# Patient Record
Sex: Male | Born: 1965 | Race: Black or African American | Hispanic: No | Marital: Married | Smoking: Former smoker
Health system: Southern US, Community
[De-identification: ages and names within clinical notes are randomized; demographics above are authoritative.]

## PROBLEM LIST (undated history)

## (undated) DIAGNOSIS — I82A11 Acute embolism and thrombosis of right axillary vein: Secondary | ICD-10-CM

## (undated) DIAGNOSIS — E785 Hyperlipidemia, unspecified: Secondary | ICD-10-CM

## (undated) DIAGNOSIS — N189 Chronic kidney disease, unspecified: Secondary | ICD-10-CM

## (undated) HISTORY — DX: Hyperlipidemia, unspecified: E78.5

---

## 2001-01-02 ENCOUNTER — Emergency Department (HOSPITAL_COMMUNITY): Admission: EM | Admit: 2001-01-02 | Discharge: 2001-01-02 | Payer: Self-pay | Admitting: Emergency Medicine

## 2001-01-02 ENCOUNTER — Encounter: Payer: Self-pay | Admitting: Emergency Medicine

## 2009-03-13 ENCOUNTER — Emergency Department (HOSPITAL_COMMUNITY): Admission: EM | Admit: 2009-03-13 | Discharge: 2009-03-13 | Payer: Self-pay | Admitting: Emergency Medicine

## 2011-01-04 LAB — POCT CARDIAC MARKERS
CKMB, poc: <1 ng/mL — ABNORMAL LOW (ref 1.0–8.0)
Myoglobin, poc: 79.1 ng/mL (ref 12–200)
Troponin i, poc: <0.05 ng/mL (ref 0.00–0.09)

## 2011-01-04 LAB — POCT I-STAT, CHEM 8
BUN: 10 mg/dL (ref 6–23)
Calcium, Ion: 1.1 mmol/L — ABNORMAL LOW (ref 1.12–1.32)
Chloride: 103 mEq/L (ref 96–112)
Creatinine, Ser: 1.1 mg/dL (ref 0.4–1.5)
Glucose, Bld: 107 mg/dL — ABNORMAL HIGH (ref 70–99)
HCT: 48 % (ref 39.0–52.0)
Hemoglobin: 16.3 g/dL (ref 13.0–17.0)
Potassium: 4.1 mEq/L (ref 3.5–5.1)
Sodium: 139 mEq/L (ref 135–145)
TCO2: 26 mmol/L (ref 0–100)

## 2012-08-18 ENCOUNTER — Ambulatory Visit (INDEPENDENT_AMBULATORY_CARE_PROVIDER_SITE_OTHER): Payer: 59 | Admitting: General Surgery

## 2012-08-18 ENCOUNTER — Encounter (INDEPENDENT_AMBULATORY_CARE_PROVIDER_SITE_OTHER): Payer: Self-pay | Admitting: General Surgery

## 2012-08-18 VITALS — BP 114/80 | HR 89 | Ht 71.0 in | Wt 184.6 lb

## 2012-08-18 DIAGNOSIS — D170 Benign lipomatous neoplasm of skin and subcutaneous tissue of head, face and neck: Secondary | ICD-10-CM

## 2012-08-18 NOTE — Progress Notes (Signed)
Patient ID: Paul Swanson, male   DOB: 02-06-1966, 46 y.o.   MRN: [id number]  Chief Complaint  Patient presents with  . Pre-op Exam    eval cyst on scalp    HPI Paul Swanson is a 46 y.o. male.  Has previous history of having cysts of his head. Patient underwent local excision from his PCP of the left posterior cyst. Patient has a midline forehead cyst which is to be excised under local. The size of greater than was expected the patient was referred for evaluation and excision. Patient states that the areas that were drained. HPI  Past Medical History  Diagnosis Date  . Hyperlipidemia     History reviewed. No pertinent past surgical history.  History reviewed. No pertinent family history.  Social History History  Substance Use Topics  . Smoking status: Former Games developer  . Smokeless tobacco: Former Neurosurgeon    Quit date: 08/19/1991  . Alcohol Use: Yes     Comment: social    No Known Allergies  Current Outpatient Prescriptions  Medication Sig Dispense Refill  . simvastatin (ZOCOR) 20 MG tablet Take 20 mg by mouth every evening.        Review of Systems Review of Systems  All other systems reviewed and are negative.    Blood pressure 114/80, pulse 89, height 5\' 11"  (1.803 m), weight 184 lb 9.6 oz (83.734 kg), SpO2 97.00%.  Physical Exam Physical Exam  Constitutional: He appears well-developed and well-nourished.  HENT:  Head: Normocephalic and atraumatic.    Eyes: Conjunctivae normal and EOM are normal. Pupils are equal, round, and reactive to light.  Neck: Normal range of motion. Neck supple.  Cardiovascular: Normal rate, regular rhythm and normal heart sounds.   Pulmonary/Chest: Effort normal and breath sounds normal.  Abdominal: Bowel sounds are normal.  Musculoskeletal: Normal range of motion.  Neurological: He is alert.    Data Reviewed none  Assessment    The patient is a 46 year old male with a forehead lipoma.    Plan    We'll plan going to the  operating room for excision of the fatty mass.  All risks and benefits were discussed with the patient, to generally include infection, bleeding, damage to surrounding structures, and recurrence. Alternatives were offered and described.  All questions were answered and the patient voiced understanding of the procedure and wishes to proceed at this point.        Marigene Ehlers., Riyan Gavina 08/18/2012, 2:07 PM

## 2012-08-29 ENCOUNTER — Ambulatory Visit (INDEPENDENT_AMBULATORY_CARE_PROVIDER_SITE_OTHER): Payer: 59 | Admitting: General Surgery

## 2012-09-18 ENCOUNTER — Other Ambulatory Visit (INDEPENDENT_AMBULATORY_CARE_PROVIDER_SITE_OTHER): Payer: Self-pay | Admitting: General Surgery

## 2012-09-18 DIAGNOSIS — D1739 Benign lipomatous neoplasm of skin and subcutaneous tissue of other sites: Secondary | ICD-10-CM

## 2012-10-03 ENCOUNTER — Encounter (INDEPENDENT_AMBULATORY_CARE_PROVIDER_SITE_OTHER): Payer: Self-pay | Admitting: General Surgery

## 2012-10-03 ENCOUNTER — Ambulatory Visit (INDEPENDENT_AMBULATORY_CARE_PROVIDER_SITE_OTHER): Payer: 59 | Admitting: General Surgery

## 2012-10-03 ENCOUNTER — Encounter (INDEPENDENT_AMBULATORY_CARE_PROVIDER_SITE_OTHER): Payer: 59 | Admitting: General Surgery

## 2012-10-03 VITALS — BP 126/78 | HR 78 | Temp 98.8°F | Resp 18 | Ht 71.0 in | Wt 188.0 lb

## 2012-10-03 DIAGNOSIS — D179 Benign lipomatous neoplasm, unspecified: Secondary | ICD-10-CM

## 2012-10-03 NOTE — Progress Notes (Signed)
Patient ID: Paul Swanson, male   DOB: [birth year], 47 y.o.   MRN: [id number] The patient is a 47 year old male status post excision of forehead. Patient has been doing well postoperatively without any complaint.  On exam:  The wound Clean dry and intact  Assessment and plan:  Patient okay to shower. Stitches and Dermabond wall disintegrated on her own. The patient follow up when necessary.

## 2013-08-27 DIAGNOSIS — I82A11 Acute embolism and thrombosis of right axillary vein: Secondary | ICD-10-CM

## 2013-08-27 HISTORY — DX: Acute embolism and thrombosis of right axillary vein: I82.A11

## 2013-09-05 ENCOUNTER — Emergency Department (HOSPITAL_COMMUNITY)
Admission: EM | Admit: 2013-09-05 | Discharge: 2013-09-05 | Disposition: A | Discharge status: Home / Self Care | Payer: BC Managed Care – PPO | Attending: Emergency Medicine | Admitting: Emergency Medicine

## 2013-09-05 ENCOUNTER — Encounter (HOSPITAL_COMMUNITY): Payer: Self-pay | Admitting: Emergency Medicine

## 2013-09-05 DIAGNOSIS — Z87891 Personal history of nicotine dependence: Secondary | ICD-10-CM | POA: Insufficient documentation

## 2013-09-05 DIAGNOSIS — I82401 Acute embolism and thrombosis of unspecified deep veins of right lower extremity: Secondary | ICD-10-CM

## 2013-09-05 DIAGNOSIS — I82409 Acute embolism and thrombosis of unspecified deep veins of unspecified lower extremity: Secondary | ICD-10-CM | POA: Insufficient documentation

## 2013-09-05 DIAGNOSIS — E785 Hyperlipidemia, unspecified: Secondary | ICD-10-CM | POA: Insufficient documentation

## 2013-09-05 MED ORDER — HYDROCODONE-ACETAMINOPHEN 5-325 MG PO TABS: 1.0000 | ORAL_TABLET | Freq: Four times a day (QID) | ORAL | Status: DC | PRN

## 2013-09-05 MED ORDER — XARELTO VTE STARTER PACK 15 & 20 MG PO TBPK: 15.0000 mg | ORAL_TABLET | ORAL | Status: AC

## 2013-09-05 NOTE — ED Provider Notes (Signed)
CSN: [id number]     Arrival date & time 09/05/13  1752 History   This chart was scribed for Paul Crumble, PA-C, with Shanna Cisco, MD, by Paul Swanson, ED Scribe. This patient was seen in room TR11C/TR11C and the patient's care was started at   6:44 PM    Chief Complaint  Patient presents with  . Leg Pain   The history is provided by the patient. No language interpreter was used.   HPI Comments: Paul Swanson is a 47 y.o. male who presents to the Emergency Department complaining of right leg pain in calf onset this morning while walking. Pt states that he has as seen orthopedic today prior to visit. Pt reports that he has recently traveled on an airplane to [place] that departed Saturday 09/01/13, and returned Sunday 09/02/13, and was concerned . He states that walking worsens the pain. Pt denies history of DVT. Pt denies any other pain or symptoms.   Past Medical History  Diagnosis Date  . Hyperlipidemia    History reviewed. No pertinent past surgical history. History reviewed. No pertinent family history. History  Substance Use Topics  . Smoking status: Former Games developer  . Smokeless tobacco: Former Neurosurgeon    Quit date: 08/19/1991  . Alcohol Use: Yes     Comment: social    Review of Systems  Musculoskeletal: Positive for myalgias (right calf).  All other systems reviewed and are negative.    Allergies  Review of patient's allergies indicates no known allergies.  Home Medications   Current Outpatient Rx  Name  Route  Sig  Dispense  Refill  . cyclobenzaprine (FLEXERIL) 10 MG tablet   Oral   Take 10 mg by mouth 3 (three) times daily as needed for muscle spasms.         Marland Kitchen ibuprofen (ADVIL,MOTRIN) 200 MG tablet   Oral   Take 200 mg by mouth every 6 (six) hours as needed for fever.         . simvastatin (ZOCOR) 20 MG tablet   Oral   Take 20 mg by mouth every evening.          Triage Vitals BP 124/86  Pulse 76  Temp(Src) 98.4 F (36.9 C) (Oral)  Resp 16   Wt 190 lb 4 oz (86.297 kg)  SpO2 100%  Physical Exam  Nursing note and vitals reviewed. Constitutional: He is oriented to person, place, and time. He appears well-developed and well-nourished. No distress.  HENT:  Head: Normocephalic and atraumatic.  Eyes: EOM are normal.  Neck: Neck supple. No tracheal deviation present.  Cardiovascular: Normal rate.   Normal DP pulses   Pulmonary/Chest: Effort normal. No respiratory distress.  Musculoskeletal: Normal range of motion.  Normal appearing right knee, calf and ankle. No significant swelling. Tenderness palpation of right posterior calf muscle. Pain with dorsilflexion, plantar flexion against resistance.  Positive homan's sign  Neurological: He is alert and oriented to person, place, and time.  Skin: Skin is warm and dry.  Psychiatric: He has a normal mood and affect. His behavior is normal.    ED Course  Procedures (including critical care time) DIAGNOSTIC STUDIES: Oxygen Saturation is 100% on RA, normal by my interpretation.    COORDINATION OF CARE: 6:50 PM- Pt advised of plan for treatment and pt agrees.    Labs Review Labs Reviewed - No data to display Imaging Review No results found.  EKG Interpretation   None      Author: Gara Kroner, RVT  Service: Vascular Lab Author Type: Cardiovascular Sonographer   Filed: 09/05/2013 7:20 PM Note Time: 09/05/2013 7:18 PM Status: Signed   Editor: Gara Kroner, RVT (Cardiovascular Sonographer)      VASCULAR LAB  PRELIMINARY PRELIMINARY PRELIMINARY PRELIMINARY  Right lower extremity venous duplex completed.  Preliminary report: Right: DVT noted in the proximal PTV and peroneal v. No propagation into the popliteal vein noted. No evidence of superficial thrombosis. No Baker's cyst.  Swanson, Paul, RVT  09/05/2013, 7:18 PM    MDM   1. DVT (deep venous thrombosis), right     Pt is positive for DVT. No signs of PE. VS normal. Offered options of starting on xarelto and  coumadin/warfarin. Pt chose xarelto at this time. Will follow up with PCP closely for recheck. Given list of symptoms that should prompt his return. Questions answered.   Filed Vitals:   09/05/13 1808  BP: 124/86  Pulse: 76  Temp: 98.4 F (36.9 C)  Resp: 16    I personally performed the services described in this documentation, which was scribed in my presence. The recorded information has been reviewed and is accurate.    Lottie Mussel, PA-C 09/05/13 1931

## 2013-09-05 NOTE — Progress Notes (Signed)
VASCULAR LAB PRELIMINARY  PRELIMINARY  PRELIMINARY  PRELIMINARY  Right lower extremity venous duplex completed.    Preliminary report:  Right:  DVT noted in the proximal PTV and peroneal v. No propagation into the popliteal vein noted. No evidence of superficial thrombosis.  No Baker's cyst.  Beth Spackman, RVT 09/05/2013, 7:18 PM

## 2013-09-05 NOTE — ED Notes (Signed)
Venous doppler will be coming , stated by Nursing Secretary Victorino Dike

## 2013-09-05 NOTE — ED Notes (Signed)
Victorino Dike , Nursing Secretary is calling us for doppler for pt.

## 2013-09-05 NOTE — ED Notes (Signed)
Pt c/o rt calf pain that started this morning. Pt went to urgent care to be evaluated for possible DVT. Pt rates pain 7/10 when ambulating, 3/10 when sitting. Pt states he went out of town recently on a plane. No redness, swelling, or warmth noted.

## 2013-09-05 NOTE — ED Notes (Signed)
Presents from [facility] ortho docs with right calf pain that began this AM, +homans sign. Concerned for DVT sent here for doppler. Recent air travel of 4 hours. dneis hx of DVT. No redness to calf. Walking makes pain worse. Cms intact.

## 2013-09-06 ENCOUNTER — Ambulatory Visit
Admission: RE | Admit: 2013-09-06 | Discharge: 2013-09-06 | Disposition: A | Discharge status: Home / Self Care | Payer: 59 | Source: Ambulatory Visit | Attending: Family Medicine | Admitting: Family Medicine

## 2013-09-06 ENCOUNTER — Other Ambulatory Visit: Payer: Self-pay | Admitting: Family Medicine

## 2013-09-06 DIAGNOSIS — I82401 Acute embolism and thrombosis of unspecified deep veins of right lower extremity: Secondary | ICD-10-CM

## 2013-09-06 MED ORDER — IOHEXOL 350 MG/ML SOLN
100.0000 mL | Freq: Once | INTRAVENOUS | Status: AC | PRN
  Administered 2013-09-06: 100 mL via INTRAVENOUS

## 2013-09-06 NOTE — ED Provider Notes (Signed)
Medical screening examination/treatment/procedure(s) were performed by non-physician practitioner and as supervising physician I was immediately available for consultation/collaboration.  EKG Interpretation   None         Megan E Docherty, MD 09/06/13 0121 

## 2013-09-15 ENCOUNTER — Emergency Department (HOSPITAL_COMMUNITY)
Admission: EM | Admit: 2013-09-15 | Discharge: 2013-09-15 | Disposition: A | Discharge status: Home / Self Care | Payer: BC Managed Care – PPO | Attending: Emergency Medicine | Admitting: Emergency Medicine

## 2013-09-15 ENCOUNTER — Encounter (HOSPITAL_COMMUNITY): Payer: Self-pay | Admitting: Emergency Medicine

## 2013-09-15 DIAGNOSIS — M79604 Pain in right leg: Secondary | ICD-10-CM

## 2013-09-15 DIAGNOSIS — M7981 Nontraumatic hematoma of soft tissue: Secondary | ICD-10-CM | POA: Insufficient documentation

## 2013-09-15 DIAGNOSIS — Z86718 Personal history of other venous thrombosis and embolism: Secondary | ICD-10-CM | POA: Insufficient documentation

## 2013-09-15 DIAGNOSIS — R58 Hemorrhage, not elsewhere classified: Secondary | ICD-10-CM

## 2013-09-15 DIAGNOSIS — Z87891 Personal history of nicotine dependence: Secondary | ICD-10-CM | POA: Insufficient documentation

## 2013-09-15 DIAGNOSIS — E785 Hyperlipidemia, unspecified: Secondary | ICD-10-CM | POA: Insufficient documentation

## 2013-09-15 DIAGNOSIS — Z7901 Long term (current) use of anticoagulants: Secondary | ICD-10-CM | POA: Insufficient documentation

## 2013-09-15 DIAGNOSIS — M79609 Pain in unspecified limb: Secondary | ICD-10-CM | POA: Insufficient documentation

## 2013-09-15 HISTORY — DX: Acute embolism and thrombosis of right axillary vein: I82.A11

## 2013-09-15 NOTE — ED Provider Notes (Signed)
Medical screening examination/treatment/procedure(s) were performed by non-physician practitioner and as supervising physician I was immediately available for consultation/collaboration.  EKG Interpretation   None        Zaryah Seckel R. Krystyna Cleckley, MD 09/15/13 1536 

## 2013-09-15 NOTE — ED Notes (Signed)
Patient is from home. Patient complains of right leg pain and bruise. Patient was treated for DVT last week at [facility] started on Xarelto. Patient states he was icing his leg last night for pain and he fell asleep with ice on it last night, when he woke this morning he noticed the bruise. Patient is AX0X3 in NAD.

## 2013-09-15 NOTE — ED Provider Notes (Signed)
CSN: [id number]     Arrival date & time 09/15/13  1025 History   First MD Initiated Contact with Patient 09/15/13 1051     Chief Complaint  Patient presents with  . Leg Pain    Bruise on leg    (Consider location/radiation/quality/duration/timing/severity/associated sxs/prior Treatment) HPI Comments: Patient presents with a chief complaint of right lower leg pain and a small area of bruising.  He was diagnosed with a DVT of the right leg ten days ago.  He was started on Xarelto at that time.  He reports that he has been compliant with this medication.  He states that last evening he applied ice to his leg and then fell asleep.  He reports that the ice was on his leg for 3-4 hours before he woke up.  This morning he noticed a small bruise in the area where the ice had been.  He denies any acute injury or trauma.  He denies numbness or tingling of his leg or foot.  He denies any swelling.  He reports that he is having mild pain of his right calf, but reports that he has had pain since he was diagnosed ten days ago.  He denies any increase in pain.  He denies chest pain or SOB.  The history is provided by the patient.    Past Medical History  Diagnosis Date  . Hyperlipidemia   . DVT of axillary vein, acute right 12/14   History reviewed. No pertinent past surgical history. No family history on file. History  Substance Use Topics  . Smoking status: Former Games developer  . Smokeless tobacco: Former Neurosurgeon    Quit date: 08/19/1991  . Alcohol Use: Yes     Comment: social    Review of Systems  All other systems reviewed and are negative.    Allergies  Review of patient's allergies indicates no known allergies.  Home Medications   Current Outpatient Rx  Name  Route  Sig  Dispense  Refill  . cyclobenzaprine (FLEXERIL) 10 MG tablet   Oral   Take 10 mg by mouth 3 (three) times daily as needed for muscle spasms.         Marland Kitchen HYDROcodone-acetaminophen (NORCO) 5-325 MG per tablet   Oral  Take 1 tablet by mouth every 6 (six) hours as needed for moderate pain.   20 tablet   0   . ibuprofen (ADVIL,MOTRIN) 200 MG tablet   Oral   Take 200 mg by mouth every 6 (six) hours as needed for fever.         . simvastatin (ZOCOR) 20 MG tablet   Oral   Take 20 mg by mouth every evening.         Carlena Hurl STARTER PACK 15 & 20 MG TBPK   Oral   Take 15-20 mg by mouth as directed. Take as directed on package: Start with one 15mg  tablet by mouth twice a day with food. On Day 22, switch to one 20mg  tablet once a day with food.   51 each   0     Dispense as written.    BP 135/85  Pulse 85  Temp(Src) 98 F (36.7 C) (Oral)  Resp 20  SpO2 94% Physical Exam  Nursing note and vitals reviewed. Constitutional: He appears well-developed and well-nourished.  HENT:  Head: Normocephalic and atraumatic.  Neck: Normal range of motion. Neck supple.  Cardiovascular: Normal rate, regular rhythm and normal heart sounds.   Pulses:  Dorsalis pedis pulses are 2+ on the right side, and 2+ on the left side.  Pulmonary/Chest: Effort normal and breath sounds normal.  Neurological: He is alert.  Distal sensation of the right foot is intact  Skin: Skin is warm and dry.     Psychiatric: He has a normal mood and affect.    ED Course  Procedures (including critical care time) Labs Review Labs Reviewed - No data to display Imaging Review No results found.  EKG Interpretation   None       MDM  No diagnosis found. Patient presenting with mild right lower leg pain and a small bruise of the right lower leg.  No acute injury or trauma.  Patient diagnosed with DVT ten days ago and started on Xarelto.  He reports that he is compliant with medications.  Patient with small bruise to the right leg.  He is neurovascularly intact.  He denies SOB or chest pain.  VSS.   Feel that the patient is stable for discharge.  Patient instructed to follow up with PCP.  Return precautions  given.    Santiago Glad, PA-C 09/15/13 1301

## 2013-09-25 ENCOUNTER — Telehealth: Payer: Self-pay | Admitting: Oncology

## 2013-09-25 NOTE — Telephone Encounter (Signed)
PT RETURN CALL AND GVE NP APPT 12/31 @ 1:30 W/DR. SHADAD REFERRING DR. LISA MILLER DX-POSITIVE FACTOR VIII

## 2013-09-25 NOTE — Telephone Encounter (Signed)
C/D 09/25/13 for appt. 09/26/13

## 2013-09-26 ENCOUNTER — Other Ambulatory Visit: Payer: BC Managed Care – PPO

## 2013-09-26 ENCOUNTER — Ambulatory Visit: Payer: BC Managed Care – PPO

## 2013-09-26 ENCOUNTER — Telehealth: Payer: Self-pay | Admitting: *Deleted

## 2013-09-26 ENCOUNTER — Ambulatory Visit (HOSPITAL_BASED_OUTPATIENT_CLINIC_OR_DEPARTMENT_OTHER): Payer: BC Managed Care – PPO | Admitting: Oncology

## 2013-09-26 VITALS — BP 132/93 | HR 80 | Temp 98.3°F | Resp 20 | Ht 71.0 in | Wt 193.0 lb

## 2013-09-26 DIAGNOSIS — Z86718 Personal history of other venous thrombosis and embolism: Secondary | ICD-10-CM

## 2013-09-26 DIAGNOSIS — Z7901 Long term (current) use of anticoagulants: Secondary | ICD-10-CM

## 2013-09-26 DIAGNOSIS — I82409 Acute embolism and thrombosis of unspecified deep veins of unspecified lower extremity: Secondary | ICD-10-CM

## 2013-09-26 NOTE — Consult Note (Signed)
Reason for Referral: Deep vein thrombosis.   HPI: This is a 47 year old gentleman native of [place] on currently living in [place] for an extended period of time. He is a healthy African American gentleman with history of hyperlipidemia but really no other comorbid conditions. Was in his usual state of health, when he presented with right-sided calf pain that started 3 days after a flight to [place]. He presented to the emergency department on 09/05/2013 and underwent lower extremity Dopplers which revealed the presence of a right posterior tibial vein and the right peroneal vein deep vein thrombosis. He was started on Xarelto and had a CT scan of the chest done on 09/06/2013 which showed no pulmonary embolism. He was evaluated by his primary care physician and had a hypercoagulable panel which I have reviewed was essentially unremarkable except for an elevated factor VIII activity. 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 Patient referred to me for the evaluation for inherited thrombophilia. Clinically, he is feeling relatively well and hasn't noticed significant decrease in his calf pain. He has not reported any other symptoms leading up to this diagnosis. He has not reported any abdominal pain or weight loss. Has not reported any early satiety or change in his bowel habits. Has not reported any hematochezia or melena. Has not reported any change in the color of his urine. He does not endorse any provoking factors other than history up to [place]. 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 He denied any trauma or orthopedic operations. He did not have any medication or supplements. He denied any using a hormone supplements as well.   Past Medical History  Diagnosis Date  . Hyperlipidemia   . DVT of axillary vein, acute right 12/14  :  No past surgical history on file.:  Current Outpatient Prescriptions  Medication Sig Dispense Refill  .  ibuprofen (ADVIL,MOTRIN) 200 MG tablet Take 800 mg by mouth every 6 (six) hours as needed for fever (pain).       . simvastatin (ZOCOR) 20 MG tablet Take 20 mg by mouth every evening.      Carlena Hurl STARTER PACK 15 & 20 MG TBPK Take 15-20 mg by mouth as directed. Take as directed on package: Start with one 15mg  tablet by mouth twice a day with food. On Day 22, switch to one 20mg  tablet once a day with food.  51 each  0   No current facility-administered medications for this visit.       No Known Allergies:  Family History: His family history was reviewed today and his parents are in reasonably good health.  There is no history any thrombosis and his parents or extended family. There is no history of malignancies or any blood disorders.   History   Social History  . Marital Status: Married    Spouse Name: N/A    Number of Children: N/A  . Years of Education: N/A   Occupational History  . Not on file.   Social History Main Topics  . Smoking status: Former Games developer  . Smokeless tobacco: Former Neurosurgeon    Quit date: 08/19/1991  . Alcohol Use: Yes     Comment: social  . Drug Use: No  . Sexual Activity: Not on file   Other Topics Concern  . Not on file   Social History Narrative  . No narrative on file  :  Constitutional: negative for anorexia, chills, fatigue and  night sweats Eyes: negative for icterus, irritation and visual disturbance Ears, nose, mouth, throat, and face: negative  for epistaxis, hoarseness and sore mouth Respiratory:  Negative for asthma, cough, hemoptysis and wheezing Cardiovascular: Negative for chest pain, dyspnea, fatigue and lower extremity edema Gastrointestinal: negative for abdominal pain, diarrhea, jaundice and odynophagia Genitourinary:negative for hematuria, hesitancy and nocturia Hematologic/lymphatic: for bleeding, easy bruising and lymphadenopathy Musculoskeletal:negative for arthralgias, back pain and bone pain Neurological: negative for  coordination problems, dizziness and gait problems Behavioral/Psych: negative for anxiety, depression and fatigue Endocrine: negative for diabetic symptoms including blurry vision and pruritus Allergic/Immunologic: negative for urticaria  Exam: Blood pressure 132/93, pulse 80, temperature 98.3 F (36.8 C), resp. rate 20, height 5\' 11"  (1.803 m), weight 193 lb (87.544 kg), SpO2 99.00%. General appearance: alert, cooperative and appears stated age Head: Normocephalic, without obvious abnormality, atraumatic Eyes: conjunctivae/corneas clear. PERRL, EOM's intact. Fundi benign. Throat: lips, mucosa, and tongue normal; teeth and gums normal Neck: no adenopathy, no carotid bruit, no JVD, supple, symmetrical, trachea midline and thyroid not enlarged, symmetric, no tenderness/mass/nodules Back: symmetric, no curvature. ROM normal. No CVA tenderness. Resp: clear to auscultation bilaterally Chest wall: no tenderness Cardio: regular rate and rhythm, S1, S2 normal, no murmur, click, rub or gallop GI: soft, non-tender; bowel sounds normal; no masses,  no organomegaly Extremities: extremities normal, atraumatic, no cyanosis or edema Pulses: 2+ and symmetric Skin: Skin color, texture, turgor normal. No rashes or lesions Lymph nodes: Cervical, supraclavicular, and axillary nodes normal. Neurologic: Grossly normal   Ct Angio Chest Pe W/cm &/or Wo Cm  09/06/2013   CLINICAL DATA:  New diagnosis right lower extremity DVT.  EXAM: CT ANGIOGRAPHY CHEST WITH CONTRAST  TECHNIQUE: Multidetector CT imaging of the chest was performed using the standard protocol during bolus administration of intravenous contrast. Multiplanar CT image reconstructions including MIPs were obtained to evaluate the vascular anatomy.  CONTRAST:  100 mL OMNIPAQUE IOHEXOL 350 MG/ML SOLN  COMPARISON:  Plain film of the chest 03/13/2009.  FINDINGS: No pulmonary embolus is identified. Heart size is normal. No pleural or pericardial. No axillary,  hilar or mediastinal lymphadenopathy. Lungs demonstrate only some dependent atelectatic change. Incidentally imaged upper abdomen is unremarkable. No focal bony abnormality.  Review of the MIP images confirms the above findings.  IMPRESSION: Negative for pulmonary embolus.  Negative exam.   Electronically Signed   By: Drusilla Kanner M.D.   On: 09/06/2013 14:09    Assessment and Plan:   47 year old gentleman with the recent diagnosis of the right lower extremity DVT. The natural course of thrombophilia was discussed in general as well as pertaining to him his overall risk factors. I see no clear-cut provoking symptoms in his lower extremity deep vein thrombosis that was diagnosed in December of 2014. 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 He has no orthopedic surgery or exposure to drugs that could have caused this. His hypercoagulable panel was discussed today including his elevated factor VIII level. I feel that this is probably due to an acute phase reactant rather than inherited thrombophilia. I believe that his factor VIII levels should exceed the 300% before this could be considered a possible risk factor. From a management standpoint, I will advise with 6 months of anticoagulation and then I will repeat his factor VIII level at that time for confirmatory testing. I see no evidence to suggest occult malignancy causing his thrombosis at this time. He has no GI  or GU symptoms at this time to trigger any further workup. I did recommend that he undergoes age-appropriate cancer screening including a colonoscopy in the near future. All his questions were answered to his satisfaction.

## 2013-09-26 NOTE — Progress Notes (Signed)
Please see consult note.  

## 2013-09-26 NOTE — Telephone Encounter (Signed)
appts made and printed...td 

## 2013-12-07 ENCOUNTER — Telehealth: Payer: Self-pay | Admitting: *Deleted

## 2013-12-07 NOTE — Telephone Encounter (Signed)
Patient's wife calling to say patient has been reading on the internet about the possible side effects of xaralto. Patient wants to know if there is a better drug he should be taking, with less side effects? Per dr Alen Blew, no, continue with xaralto. All of the drugs have similar side effects.

## 2014-03-04 ENCOUNTER — Telehealth: Payer: Self-pay | Admitting: Oncology

## 2014-03-04 NOTE — Telephone Encounter (Signed)
Pt called and r/s lab

## 2014-03-05 ENCOUNTER — Other Ambulatory Visit: Payer: BC Managed Care – PPO

## 2014-03-05 DIAGNOSIS — Z86718 Personal history of other venous thrombosis and embolism: Secondary | ICD-10-CM

## 2014-03-06 ENCOUNTER — Other Ambulatory Visit: Payer: BC Managed Care – PPO

## 2014-03-08 LAB — FACTOR 8 ASSAY: Coagulation Factor VIII: 80 % (ref 73–140)

## 2014-03-19 ENCOUNTER — Encounter: Payer: Self-pay | Admitting: Oncology

## 2014-03-19 ENCOUNTER — Ambulatory Visit (HOSPITAL_BASED_OUTPATIENT_CLINIC_OR_DEPARTMENT_OTHER): Payer: BC Managed Care – PPO | Admitting: Oncology

## 2014-03-19 VITALS — BP 124/75 | HR 86 | Temp 98.1°F | Resp 18 | Ht 71.0 in | Wt 187.7 lb

## 2014-03-19 DIAGNOSIS — Z7901 Long term (current) use of anticoagulants: Secondary | ICD-10-CM

## 2014-03-19 DIAGNOSIS — I82409 Acute embolism and thrombosis of unspecified deep veins of unspecified lower extremity: Secondary | ICD-10-CM

## 2014-03-19 NOTE — Progress Notes (Signed)
Hematology and Oncology Follow Up Visit  Paul Swanson [id number] October 31, 1965 48 y.o. 03/19/2014 8:56 AM  Paul Swanson, MDRoss, Paul Haste, MD   Principle Diagnosis: 48 year old gentleman with deep vein thrombosis diagnosed in December of 2014. He presented with right sided calf pain after a slight.   Current therapy: Xarelto daily the last 6 months.  Interim History:  [name] presents today for a followup visit. He is a pleasant gentleman I saw him consultation in December of 2014. He had a deep vein thrombosis as well as increase factor VIII activity. He has been on oral Xarelto without any complications. He has not reported any bleeding complications such as epistaxis, hematochezia, melena or hemoptysis. He has not reported any other clots. He has not reported any recent hospitalizations or illnesses. He did not report any calf pain or tenderness. He continues to be very active. He does not report any headaches blurred vision or double vision. Is not reporting any motor or sensory neuropathy. Is not reporting any chest pain shortness of breath or cough. Does not report any palpitation orthopnea or PND. Does not report any frequency urgency or hesitancy. Does not report any skeletal complaints. Rest of his review of systems unremarkable  Medications: I have reviewed the patient's current medications.  Current Outpatient Prescriptions  Medication Sig Dispense Refill  . ibuprofen (ADVIL,MOTRIN) 200 MG tablet Take 800 mg by mouth every 6 (six) hours as needed for fever (pain).       . simvastatin (ZOCOR) 20 MG tablet Take 20 mg by mouth every evening.      Alveda Reasons STARTER PACK 15 & 20 MG TBPK Take 15-20 mg by mouth as directed. Take as directed on package: Start with one 15mg  tablet by mouth twice a day with food. On Day 22, switch to one 20mg  tablet once a day with food.  51 each  0   No current facility-administered medications for this visit.     Allergies: No Known Allergies  Past Medical  History, Surgical history, Social history, and Family History were reviewed and updated.   Physical Exam: Blood pressure 124/75, pulse 86, temperature 98.1 F (36.7 C), resp. rate 18, height 5\' 11"  (1.803 m), weight 187 lb 11.2 oz (85.14 kg). ECOG: 0 General appearance: alert Head: Normocephalic, without obvious abnormality Neck: no adenopathy Lymph nodes: Cervical, supraclavicular, and axillary nodes normal. Heart:regular rate and rhythm, S1, S2 normal, no murmur, click, rub or gallop Lung:chest clear, no wheezing, rales, normal symmetric air entry Abdomin: soft, non-tender, without masses or organomegaly EXT:no erythema, induration, or nodules   Lab Results: Lab Results  Component Value Date   HGB 16.3 03/13/2009   HCT 48.0 03/13/2009     Chemistry      Component Value Date/Time   NA 139 03/13/2009 1926   K 4.1 03/13/2009 1926   CL 103 03/13/2009 1926   BUN 10 03/13/2009 1926   CREATININE 1.1 03/13/2009 1926   No results found for this basename: CALCIUM, ALKPHOS, AST, ALT, BILITOT      Results for [name], [name] (MRN [id number]) as of 03/19/2014 09:00  Ref. Range 03/05/2014 08:18  Coagulation Factor VIII Latest Range: 73-140 % 80     Impression and Plan:   48 year old gentleman with the following issues:  1. Deep vein thrombosis provoked by a flight and immobility. His diagnosed in December 2014 and completed 6 months of anticoagulation. His hypercoagulable panel has been within normal range and now his factor VIII level has normalized. I discussed the  options at this point is continuing Xarelto indefinitely versus stopping anticoagulation. I think the risks of continuing anticoagulation exceed stay benefit and I recommended stopping the medication at this time. He understands if he develops another blood clot that was unprovoked, hematocrit lifetime anticoagulation. I also advised him to avoid situations that prolongs immobility. I also encouraged hydration and stretching at  least every 2 hours on any long car rides or flights. Low-dose aspirin might also be a reasonable idea given his age for cardiovascular prevention purposes.   2. Age-appropriate cancer screening: Have advised him to continue doing so Coumadin colonoscopy as well as prostate cancer screening.  Followup will be as needed he'll be happy to see him in the future if needed to.  [facility], MD 6/23/20158:56 AM

## 2014-11-11 ENCOUNTER — Telehealth: Payer: Self-pay

## 2014-11-11 NOTE — Telephone Encounter (Signed)
Pt had booked a flight and is scared to fly d/t history of blood clot. He has Paediatric nurse. The insurance is asking for letter on doctor's letterhead verifying history of blood clot. Family can pick it up when ready. Wife cell phone is [phone]

## 2014-11-12 ENCOUNTER — Encounter: Payer: Self-pay | Admitting: *Deleted

## 2014-11-12 NOTE — Progress Notes (Signed)
Per patient request, signed letter left at front for patient to p/u. Re: flying. Wife nicole notified.

## 2014-11-27 ENCOUNTER — Emergency Department (HOSPITAL_COMMUNITY)
Admission: EM | Admit: 2014-11-27 | Discharge: 2014-11-27 | Disposition: A | Discharge status: Home / Self Care | Payer: BLUE CROSS/BLUE SHIELD | Attending: Emergency Medicine | Admitting: Emergency Medicine

## 2014-11-27 ENCOUNTER — Encounter (HOSPITAL_COMMUNITY): Payer: Self-pay | Admitting: Emergency Medicine

## 2014-11-27 DIAGNOSIS — Z86718 Personal history of other venous thrombosis and embolism: Secondary | ICD-10-CM | POA: Diagnosis not present

## 2014-11-27 DIAGNOSIS — M79604 Pain in right leg: Secondary | ICD-10-CM | POA: Diagnosis present

## 2014-11-27 DIAGNOSIS — Z87891 Personal history of nicotine dependence: Secondary | ICD-10-CM | POA: Diagnosis not present

## 2014-11-27 DIAGNOSIS — M79661 Pain in right lower leg: Secondary | ICD-10-CM

## 2014-11-27 DIAGNOSIS — Z7901 Long term (current) use of anticoagulants: Secondary | ICD-10-CM | POA: Insufficient documentation

## 2014-11-27 DIAGNOSIS — E785 Hyperlipidemia, unspecified: Secondary | ICD-10-CM | POA: Insufficient documentation

## 2014-11-27 MED ORDER — ENOXAPARIN SODIUM 100 MG/ML ~~LOC~~ SOLN
85.0000 mg | Freq: Once | SUBCUTANEOUS | Status: AC
  Administered 2014-11-27: 85 mg via SUBCUTANEOUS
  Filled 2014-11-27: qty 1

## 2014-11-27 NOTE — ED Notes (Signed)
Pt st's he has a hx of DVT.  St's he was on a flight 1 1/2 weeks ago.  Today started having pain in right calf area.

## 2014-11-27 NOTE — ED Provider Notes (Signed)
CSN: [id number]     Arrival date & time 11/27/14  1918 History   First MD Initiated Contact with Patient 11/27/14 2031     No chief complaint on file.  (Consider location/radiation/quality/duration/timing/severity/associated sxs/prior Treatment) HPI  Jerol Rufener is a 49 yo male presenting with report of pain in his right calf.  He states he has intermittent pain x a few weeks but today the pain became more bothersome.  He reports the pain was similar to his calf pain last year when he was diagnosed with a DVT.  He was started on Xarelto which was stopped 6 months ago.  He states he travels frequently via airplane and was concerned this pain could be another DVT.  He denies any leg swelling, redness, shortness of breath or cough.   Past Medical History  Diagnosis Date  . Hyperlipidemia   . DVT of axillary vein, acute right 12/14   History reviewed. No pertinent past surgical history. No family history on file. History  Substance Use Topics  . Smoking status: Former Research scientist (life sciences)  . Smokeless tobacco: Former Systems developer    Quit date: 08/19/1991  . Alcohol Use: Yes     Comment: social    Review of Systems  Constitutional: Negative for fever and chills.  HENT: Negative for sore throat.   Eyes: Negative for visual disturbance.  Respiratory: Negative for cough and shortness of breath.   Cardiovascular: Negative for chest pain and leg swelling.  Gastrointestinal: Negative for nausea, vomiting and diarrhea.  Genitourinary: Negative for dysuria.  Musculoskeletal: Positive for myalgias.  Skin: Negative for rash.  Neurological: Negative for weakness, numbness and headaches.      Allergies  Review of patient's allergies indicates no known allergies.  Home Medications   Prior to Admission medications   Medication Sig Start Date End Date Taking? Authorizing Provider  ibuprofen (ADVIL,MOTRIN) 200 MG tablet Take 800 mg by mouth every 6 (six) hours as needed for fever (pain).     Historical Provider,  MD  simvastatin (ZOCOR) 20 MG tablet Take 20 mg by mouth every evening.    Historical Provider, MD  XARELTO STARTER PACK 15 & 20 MG TBPK Take 15-20 mg by mouth as directed. Take as directed on package: Start with one 15mg  tablet by mouth twice a day with food. On Day 22, switch to one 20mg  tablet once a day with food. 09/05/13   Tatyana A Kirichenko, PA-C   BP 120/87 mmHg  Pulse 67  Temp(Src) 98.5 F (36.9 C) (Oral)  Resp 18  SpO2 97% Physical Exam  Constitutional: He appears well-developed and well-nourished. No distress.  HENT:  Head: Normocephalic and atraumatic.  Eyes: Conjunctivae are normal. Right eye exhibits no discharge. Left eye exhibits no discharge. No scleral icterus.  Neck: Neck supple.  Cardiovascular: Normal rate, regular rhythm and intact distal pulses.  Exam reveals no gallop and no friction rub.   No murmur heard. Pulmonary/Chest: Effort normal and breath sounds normal. No respiratory distress. He has no wheezes. He has no rales. He exhibits no tenderness.  Abdominal: Soft. There is no tenderness.  Musculoskeletal: He exhibits tenderness.  No erythema, warmth or swelling noted.  Lymphadenopathy:    He has no cervical adenopathy.  Neurological: He is alert. Coordination normal.  Skin: Skin is warm and dry. No rash noted. He is not diaphoretic.  Psychiatric: He has a normal mood and affect.  Nursing note and vitals reviewed.   ED Course  Procedures (including critical care time) Labs Review Labs  Reviewed - No data to display  Imaging Review No results found.   EKG Interpretation None      MDM   Final diagnoses:  Calf pain, right   49 yo with history of right calf DVT, treatment stopped 6 months ago, now with right calf pain and report of recent plane travel.  No swelling or redness noted on exam and no report chest pain or shortness of breath.  Discussed with pt will need outpt lower extremity doppler but will begin treatment with lovenox in the ED. Pt  is well-appearing, in no acute distress and vital signs reviewed and not concerning. He appears safe to be discharged. Return precautions provided. Pt aware of plan and in agreement.   Filed Vitals:   11/27/14 1932 11/27/14 2112  BP: 120/87 111/85  Pulse: 67 68  Temp: 98.5 F (36.9 C) 98.4 F (36.9 C)  TempSrc: Oral Oral  Resp: 18 14  SpO2: 97% 98%   Meds given in ED:  Medications  enoxaparin (LOVENOX) injection 85 mg (85 mg Subcutaneous Given 11/27/14 2113)    Discharge Medication List as of 11/27/2014  9:02 PM        Britt Bottom, NP 11/28/14 2347  Jasper Riling. Alvino Chapel, MD 11/29/14 0000

## 2014-11-27 NOTE — ED Notes (Signed)
MD at the bedside  

## 2014-11-27 NOTE — Discharge Instructions (Signed)
Please follow the directions provided.  Be sure to follow-up as directed for your outpatient study.  You were treated in the emergency department with a blood thinner.  Do not hesitate to return for any new, worsening or concerning symptoms.      SEEK IMMEDIATE MEDICAL CARE IF:  You have chest pain.  You have trouble breathing.  You have new or increased swelling or pain in one leg.  You cough up blood.  You notice blood in vomit, in a bowel movement, or in urine.

## 2014-11-28 ENCOUNTER — Ambulatory Visit (HOSPITAL_COMMUNITY)
Admission: RE | Admit: 2014-11-28 | Discharge: 2014-11-28 | Disposition: A | Discharge status: Home / Self Care | Payer: BLUE CROSS/BLUE SHIELD | Source: Ambulatory Visit | Attending: Emergency Medicine | Admitting: Emergency Medicine

## 2014-11-28 DIAGNOSIS — M79609 Pain in unspecified limb: Secondary | ICD-10-CM

## 2014-11-28 DIAGNOSIS — M79604 Pain in right leg: Secondary | ICD-10-CM | POA: Insufficient documentation

## 2014-11-28 NOTE — Progress Notes (Signed)
VASCULAR LAB PRELIMINARY  PRELIMINARY  PRELIMINARY  PRELIMINARY  Right lower extremity venous duplex completed.    Preliminary report:  Right:  No evidence of DVT, superficial thrombosis, or Baker's cyst.  Alyha Marines, RVT 11/28/2014, 2:52 PM

## 2016-01-04 ENCOUNTER — Emergency Department (HOSPITAL_COMMUNITY): Payer: BLUE CROSS/BLUE SHIELD

## 2016-01-04 ENCOUNTER — Encounter (HOSPITAL_COMMUNITY): Payer: Self-pay | Admitting: Oncology

## 2016-01-04 ENCOUNTER — Emergency Department (HOSPITAL_COMMUNITY)
Admission: EM | Admit: 2016-01-04 | Discharge: 2016-01-04 | Disposition: A | Discharge status: Home / Self Care | Payer: BLUE CROSS/BLUE SHIELD | Attending: Emergency Medicine | Admitting: Emergency Medicine

## 2016-01-04 DIAGNOSIS — Z79899 Other long term (current) drug therapy: Secondary | ICD-10-CM | POA: Diagnosis not present

## 2016-01-04 DIAGNOSIS — Z86718 Personal history of other venous thrombosis and embolism: Secondary | ICD-10-CM | POA: Insufficient documentation

## 2016-01-04 DIAGNOSIS — R319 Hematuria, unspecified: Secondary | ICD-10-CM

## 2016-01-04 DIAGNOSIS — R109 Unspecified abdominal pain: Secondary | ICD-10-CM | POA: Diagnosis present

## 2016-01-04 DIAGNOSIS — Z87891 Personal history of nicotine dependence: Secondary | ICD-10-CM | POA: Insufficient documentation

## 2016-01-04 DIAGNOSIS — N202 Calculus of kidney with calculus of ureter: Secondary | ICD-10-CM | POA: Insufficient documentation

## 2016-01-04 DIAGNOSIS — E785 Hyperlipidemia, unspecified: Secondary | ICD-10-CM | POA: Insufficient documentation

## 2016-01-04 DIAGNOSIS — N200 Calculus of kidney: Secondary | ICD-10-CM

## 2016-01-04 LAB — CBC WITH DIFFERENTIAL/PLATELET
BASOS PCT: 0 %
Basophils Absolute: 0 10*3/uL (ref 0.0–0.1)
Eosinophils Absolute: 0.1 10*3/uL (ref 0.0–0.7)
Eosinophils Relative: 1 %
HCT: 42.9 % (ref 39.0–52.0)
HEMOGLOBIN: 15 g/dL (ref 13.0–17.0)
Lymphocytes Relative: 16 %
Lymphs Abs: 1.4 10*3/uL (ref 0.7–4.0)
MCH: 33 pg (ref 26.0–34.0)
MCHC: 35 g/dL (ref 30.0–36.0)
MCV: 94.5 fL (ref 78.0–100.0)
Monocytes Absolute: 0.5 10*3/uL (ref 0.1–1.0)
Monocytes Relative: 6 %
NEUTROS PCT: 77 %
Neutro Abs: 6.4 10*3/uL (ref 1.7–7.7)
Platelets: 218 10*3/uL (ref 150–400)
RBC: 4.54 MIL/uL (ref 4.22–5.81)
RDW: 14.4 % (ref 11.5–15.5)
WBC: 8.4 10*3/uL (ref 4.0–10.5)

## 2016-01-04 LAB — URINALYSIS, ROUTINE W REFLEX MICROSCOPIC
Bilirubin Urine: NEGATIVE
Glucose, UA: NEGATIVE mg/dL
Ketones, ur: NEGATIVE mg/dL
NITRITE: NEGATIVE
PROTEIN: 30 mg/dL — AB
SPECIFIC GRAVITY, URINE: 1.024 (ref 1.005–1.030)
pH: 7.5 (ref 5.0–8.0)

## 2016-01-04 LAB — I-STAT CHEM 8, ED
BUN: 23 mg/dL — AB (ref 6–20)
CHLORIDE: 104 mmol/L (ref 101–111)
Calcium, Ion: 1.16 mmol/L (ref 1.12–1.23)
Creatinine, Ser: 1.3 mg/dL — ABNORMAL HIGH (ref 0.61–1.24)
Glucose, Bld: 130 mg/dL — ABNORMAL HIGH (ref 65–99)
HEMATOCRIT: 46 % (ref 39.0–52.0)
Hemoglobin: 15.6 g/dL (ref 13.0–17.0)
Potassium: 4 mmol/L (ref 3.5–5.1)
SODIUM: 143 mmol/L (ref 135–145)
TCO2: 27 mmol/L (ref 0–100)

## 2016-01-04 LAB — URINE MICROSCOPIC-ADD ON: Squamous Epithelial / LPF: NONE SEEN

## 2016-01-04 LAB — I-STAT CG4 LACTIC ACID, ED: Lactic Acid, Venous: 1.4 mmol/L (ref 0.5–2.0)

## 2016-01-04 MED ORDER — KETOROLAC TROMETHAMINE 30 MG/ML IJ SOLN
30.0000 mg | Freq: Once | INTRAMUSCULAR | Status: AC
  Administered 2016-01-04: 30 mg via INTRAVENOUS
  Filled 2016-01-04: qty 1

## 2016-01-04 MED ORDER — TAMSULOSIN HCL 0.4 MG PO CAPS
0.4000 mg | ORAL_CAPSULE | Freq: Every day | ORAL | Status: DC
  Administered 2016-01-04: 0.4 mg via ORAL
  Filled 2016-01-04: qty 1

## 2016-01-04 MED ORDER — OXYCODONE-ACETAMINOPHEN 5-325 MG PO TABS: 1.0000 | ORAL_TABLET | Freq: Four times a day (QID) | ORAL | Status: AC | PRN

## 2016-01-04 MED ORDER — ONDANSETRON 8 MG PO TBDP: ORAL_TABLET | ORAL | Status: AC

## 2016-01-04 MED ORDER — ONDANSETRON HCL 4 MG/2ML IJ SOLN
4.0000 mg | Freq: Once | INTRAMUSCULAR | Status: AC
  Administered 2016-01-04: 4 mg via INTRAVENOUS
  Filled 2016-01-04: qty 2

## 2016-01-04 MED ORDER — OXYCODONE-ACETAMINOPHEN 5-325 MG PO TABS
1.0000 | ORAL_TABLET | Freq: Once | ORAL | Status: AC
  Administered 2016-01-04: 1 via ORAL
  Filled 2016-01-04: qty 1

## 2016-01-04 NOTE — ED Notes (Signed)
Patient states he does not need to provide a urine sample as this was just completed at his physicians office earlier this week. Patient and wife have been updated that until a physician sees the patient nursing staff is limited in what orders we can initiate for the patient.

## 2016-01-04 NOTE — ED Notes (Signed)
MD at bedside. 

## 2016-01-04 NOTE — ED Provider Notes (Signed)
CSN: [id number]     Arrival date & time 01/04/16  0115 History  By signing my name below, I, Paul Swanson, attest that this documentation has been prepared under the direction and in the presence of Paul Rosamond, MD. Electronically Signed: Hansel Swanson, ED Scribe. 01/04/2016. 3:05 AM.    Chief Complaint  Patient presents with  . Flank Pain   Patient is a 50 y.o. male presenting with flank pain. The history is provided by the patient. No language interpreter was used.  Flank Pain This is a new problem. The current episode started 12 to 24 hours ago. The problem occurs constantly. The problem has not changed since onset.Pertinent negatives include no abdominal pain. Nothing aggravates the symptoms. Nothing relieves the symptoms. He has tried nothing for the symptoms. The treatment provided no relief.   HPI Comments: Paul Swanson is a 50 y.o. male who presents to the Emergency Department complaining of moderate right flank pain onset today. He reports that he had hematuria 8 days ago (now resolved) and was seen at [facility] for this symptom. His UA was negative. He states he followed up with his PCP after this visit and had urine culture showing only trace protein. He was started on Flomax at this time and reports temporary relief with this treatment until onset of his current pain. Pt states h/o similar pain with renal calculi. He does not currently have a urologist. Denies nausea, emesis, diarrhea, constipation, fever. Denies abdominal trauma.   Past Medical History  Diagnosis Date  . Hyperlipidemia   . DVT of axillary vein, acute right 12/14   History reviewed. No pertinent past surgical history. No family history on file. Social History  Substance Use Topics  . Smoking status: Former Research scientist (life sciences)  . Smokeless tobacco: Former Systems developer    Quit date: 08/19/1991  . Alcohol Use: Yes     Comment: social    Review of Systems  Constitutional: Negative for fever.  Gastrointestinal: Negative for nausea,  vomiting, abdominal pain, diarrhea and constipation.  Genitourinary: Positive for hematuria and flank pain ( left).  All other systems reviewed and are negative.  Allergies  Review of patient's allergies indicates no known allergies.  Home Medications   Prior to Admission medications   Medication Sig Start Date End Date Taking? Authorizing Provider  naproxen sodium (ANAPROX) 220 MG tablet Take 440 mg by mouth 2 (two) times daily as needed (pain).   Yes Historical Provider, MD  simvastatin (ZOCOR) 20 MG tablet Take 20 mg by mouth every evening.   Yes Historical Provider, MD  tamsulosin (FLOMAX) 0.4 MG CAPS capsule Take 0.4 mg by mouth daily. 12/27/15  Yes Historical Provider, MD  XARELTO STARTER PACK 15 & 20 MG TBPK Take 15-20 mg by mouth as directed. Take as directed on package: Start with one 15mg  tablet by mouth twice a day with food. On Day 22, switch to one 20mg  tablet once a day with food. Patient not taking: Reported on 01/04/2016 09/05/13   [name], PA-C   BP 146/116 mmHg  Pulse 83  Temp(Src) 97.7 F (36.5 C) (Oral)  Resp 16  Ht 5\' 11"  (1.803 m)  Wt 190 lb (86.183 kg)  BMI 26.51 kg/m2  SpO2 98% Physical Exam  Constitutional: He is oriented to person, place, and time. He appears well-developed and well-nourished.  HENT:  Head: Normocephalic and atraumatic.  Mouth/Throat: Oropharynx is clear and moist. No oropharyngeal exudate.  Eyes: Conjunctivae and EOM are normal. Pupils are equal, round, and reactive to  light.  Neck: Normal range of motion. Neck supple. No JVD present. No tracheal deviation present.  Cardiovascular: Normal rate, regular rhythm and normal heart sounds.  Exam reveals no gallop and no friction rub.   No murmur heard. RRR.   Pulmonary/Chest: Effort normal and breath sounds normal. No stridor. No respiratory distress. He has no wheezes. He has no rales.  Lungs CTA bilaterally.   Abdominal: Soft. He exhibits no distension. There is no tenderness. There  is no rebound and no guarding.  Hyperactive bowel sounds. Constipation.   Musculoskeletal: Normal range of motion.  Lymphadenopathy:    He has no cervical adenopathy.  Neurological: He is alert and oriented to person, place, and time. He has normal reflexes.  Skin: Skin is warm and dry.  Psychiatric: He has a normal mood and affect.  Nursing note and vitals reviewed.   ED Course  Procedures (including critical care time) DIAGNOSTIC STUDIES: Oxygen Saturation is 98% on RA, normal by my interpretation.    COORDINATION OF CARE: 2:58 AM Discussed treatment plan with pt at bedside which includes UA, CT renal and pt agreed to plan.   Labs Review Labs Reviewed  URINALYSIS, ROUTINE W REFLEX MICROSCOPIC (NOT AT [facility])    Imaging Review No results found. I have personally reviewed and evaluated these images and lab results as part of my medical decision-making.   EKG Interpretation None      MDM   Final diagnoses:  None   BP 146/116 mmHg  Pulse 83  Temp(Src) 97.7 F (36.5 C) (Oral)  Resp 16  Ht 5\' 11"  (1.803 m)  Wt 190 lb (86.183 kg)  BMI 26.51 kg/m2  SpO2 98%   Results for orders placed or performed in visit on 03/05/14  Factor 8 assay  Result Value Ref Range   Coagulation Factor VIII 80 73 - 140 %   No results found.    Results for orders placed or performed during the hospital encounter of 01/04/16  Urinalysis, Routine w reflex microscopic-may I&O cath if menses (not at [facility])  Result Value Ref Range   Color, Urine RED (A) YELLOW   APPearance TURBID (A) CLEAR   Specific Gravity, Urine 1.024 1.005 - 1.030   pH 7.5 5.0 - 8.0   Glucose, UA NEGATIVE NEGATIVE mg/dL   Hgb urine dipstick LARGE (A) NEGATIVE   Bilirubin Urine NEGATIVE NEGATIVE   Ketones, ur NEGATIVE NEGATIVE mg/dL   Protein, ur 30 (A) NEGATIVE mg/dL   Nitrite NEGATIVE NEGATIVE   Leukocytes, UA SMALL (A) NEGATIVE  CBC with Differential/Platelet  Result Value Ref Range   WBC 8.4 4.0 - 10.5 K/uL    RBC 4.54 4.22 - 5.81 MIL/uL   Hemoglobin 15.0 13.0 - 17.0 g/dL   HCT 42.9 39.0 - 52.0 %   MCV 94.5 78.0 - 100.0 fL   MCH 33.0 26.0 - 34.0 pg   MCHC 35.0 30.0 - 36.0 g/dL   RDW 14.4 11.5 - 15.5 %   Platelets 218 150 - 400 K/uL   Neutrophils Relative % 77 %   Neutro Abs 6.4 1.7 - 7.7 K/uL   Lymphocytes Relative 16 %   Lymphs Abs 1.4 0.7 - 4.0 K/uL   Monocytes Relative 6 %   Monocytes Absolute 0.5 0.1 - 1.0 K/uL   Eosinophils Relative 1 %   Eosinophils Absolute 0.1 0.0 - 0.7 K/uL   Basophils Relative 0 %   Basophils Absolute 0.0 0.0 - 0.1 K/uL  Urine microscopic-add on  Result Value Ref  Range   Squamous Epithelial / LPF NONE SEEN NONE SEEN   WBC, UA 0-5 0 - 5 WBC/hpf   RBC / HPF TOO NUMEROUS TO COUNT 0 - 5 RBC/hpf   Bacteria, UA MANY (A) NONE SEEN   Urine-Other AMORPHOUS URATES/PHOSPHATES   I-Stat Chem 8, ED  Result Value Ref Range   Sodium 143 135 - 145 mmol/L   Potassium 4.0 3.5 - 5.1 mmol/L   Chloride 104 101 - 111 mmol/L   BUN 23 (H) 6 - 20 mg/dL   Creatinine, Ser 1.30 (H) 0.61 - 1.24 mg/dL   Glucose, Bld 130 (H) 65 - 99 mg/dL   Calcium, Ion 1.16 1.12 - 1.23 mmol/L   TCO2 27 0 - 100 mmol/L   Hemoglobin 15.6 13.0 - 17.0 g/dL   HCT 46.0 39.0 - 52.0 %  I-Stat CG4 Lactic Acid, ED  Result Value Ref Range   Lactic Acid, Venous 1.40 0.5 - 2.0 mmol/L   Ct Renal Stone Study  01/04/2016  CLINICAL DATA:  Moderate left flank pain, onset today. EXAM: CT ABDOMEN AND PELVIS WITHOUT CONTRAST TECHNIQUE: Multidetector CT imaging of the abdomen and pelvis was performed following the standard protocol without IV contrast. COMPARISON:  None. FINDINGS: There is a proximal right ureteral calculus measuring 4.5 x 6 mm in cross-section and 10 mm longitudinal. This is obstructing the right ureter at the upper L3 level, with marked hydroureter and hydronephrosis. No other urinary calculi are evident. No other acute findings are evident in the abdomen or pelvis. There are unremarkable unenhanced  appearances of the liver with the exception of a focal 10 mm hypodensity in the inferior right hepatic lobe which is too small to characterize but more likely benign. Bile ducts are unremarkable. There are unremarkable unenhanced appearances of the spleen, pancreas, adrenals and left kidney. There are normal appearances of the stomach, small bowel and colon. The appendix is normal. The abdominal aorta is normal in caliber. There is mild atherosclerotic calcification. There is no adenopathy in the abdomen or pelvis. No significant abnormality is evident in the lower chest. Minimal linear basilar opacities are present, likely atelectasis or scarring. There is no significant musculoskeletal abnormality. IMPRESSION: Obstructing proximal right ureteral calculus at the upper L3 level with marked hydronephrosis. The calculus measures 4.5 x 6 mm in cross-section and 10 mm longitudinal. Electronically Signed   By: Andreas Newport M.D.   On: 01/04/2016 04:02   Medications  tamsulosin (FLOMAX) capsule 0.4 mg (0.4 mg Oral Given 01/04/16 0321)  oxyCODONE-acetaminophen (PERCOCET/ROXICET) 5-325 MG per tablet 1 tablet (not administered)  ketorolac (TORADOL) 30 MG/ML injection 30 mg (30 mg Intravenous Given 01/04/16 0321)  ondansetron (ZOFRAN) injection 4 mg (4 mg Intravenous Given 01/04/16 0321)    Feeling much better post medication.  Will d/c with pain medication zofran and strainer.  Follow up with urology in 7 days. Strain all urine and continue flomax   I personally performed the services described in this documentation, which was scribed in my presence. The recorded information has been reviewed and is accurate.     Veatrice Kells, MD 01/04/16 [phone]

## 2016-01-04 NOTE — ED Notes (Signed)
Pt c/o right sided flank pain since yesterday afternoon.  Pt saw his PCP last Tuesday and was placed on Flomax however did not take it yesterday.  Rates pain 8/10, sharp and stabbing in nature.

## 2016-01-04 NOTE — Discharge Instructions (Signed)
Dietary Guidelines to Help Prevent Kidney Stones Your risk of kidney stones can be decreased by adjusting the foods you eat. The most important thing you can do is drink enough fluid. You should drink enough fluid to keep your urine clear or pale yellow. The following guidelines provide specific information for the type of kidney stone you have had. GUIDELINES ACCORDING TO TYPE OF KIDNEY STONE Calcium Oxalate Kidney Stones  Reduce the amount of salt you eat. Foods that have a lot of salt cause your body to release excess calcium into your urine. The excess calcium can combine with a substance called oxalate to form kidney stones.  Reduce the amount of animal protein you eat if the amount you eat is excessive. Animal protein causes your body to release excess calcium into your urine. Ask your dietitian how much protein from animal sources you should be eating.  Avoid foods that are high in oxalates. If you take vitamins, they should have less than 500 mg of vitamin C. Your body turns vitamin C into oxalates. You do not need to avoid fruits and vegetables high in vitamin C. Calcium Phosphate Kidney Stones  Reduce the amount of salt you eat to help prevent the release of excess calcium into your urine.  Reduce the amount of animal protein you eat if the amount you eat is excessive. Animal protein causes your body to release excess calcium into your urine. Ask your dietitian how much protein from animal sources you should be eating.  Get enough calcium from food or take a calcium supplement (ask your dietitian for recommendations). Food sources of calcium that do not increase your risk of kidney stones include:  Broccoli.  Dairy products, such as cheese and yogurt.  Pudding. Uric Acid Kidney Stones  Do not have more than 6 oz of animal protein per day. FOOD SOURCES Animal Protein Sources  Meat (all types).  Poultry.  Eggs.  Fish, seafood. Foods High in Salt  Salt seasonings.  Soy  sauce.  Teriyaki sauce.  Cured and processed meats.  Salted crackers and snack foods.  Fast food.  Canned soups and most canned foods. Foods High in Oxalates  Grains:  Amaranth.  Barley.  Grits.  Wheat germ.  Bran.  Buckwheat flour.  All bran cereals.  Pretzels.  Whole wheat bread.  Vegetables:  Beans (wax).  Beets and beet greens.  Collard greens.  Eggplant.  Escarole.  Leeks.  Okra.  Parsley.  Rutabagas.  Spinach.  Swiss chard.  Tomato paste.  Fried potatoes.  Sweet potatoes.  Fruits:  Red currants.  Figs.  Kiwi.  Rhubarb.  Meat and Other Protein Sources:  Beans (dried).  Soy burgers and other soybean products.  Miso.  Nuts (peanuts, almonds, pecans, cashews, hazelnuts).  Nut butters.  Sesame seeds and tahini (paste made of sesame seeds).  Poppy seeds.  Beverages:  Chocolate drink mixes.  Soy milk.  Instant iced tea.  Juices made from high-oxalate fruits or vegetables.  Other:  Carob.  Chocolate.  Fruitcake.  Marmalades.   This information is not intended to replace advice given to you by your health care provider. Make sure you discuss any questions you have with your health care provider.   Document Released: 01/08/2011 Document Revised: 09/18/2013 Document Reviewed: 08/10/2013 Elsevier Interactive Patient Education 2016 [employer]  

## 2016-01-04 NOTE — ED Notes (Signed)
Patient is alert and oriented x3.  He was given DC instructions and follow up visit instructions.  Patient gave verbal understanding.  He was DC ambulatory under his own power to home.  V/S stable.  He was not showing any signs of distress on DC 

## 2016-01-04 NOTE — ED Notes (Signed)
Explained to patient and wife the rationale behind collection of urine sample. Patient then agreed to attempt urine collection. Patient was escorted to restroom with supplies to obtain urine sample.

## 2016-02-02 ENCOUNTER — Other Ambulatory Visit: Payer: Self-pay | Admitting: Urology

## 2016-02-04 ENCOUNTER — Encounter (HOSPITAL_COMMUNITY): Payer: Self-pay | Admitting: General Practice

## 2016-02-05 ENCOUNTER — Ambulatory Visit (HOSPITAL_COMMUNITY): Payer: BLUE CROSS/BLUE SHIELD

## 2016-02-05 ENCOUNTER — Ambulatory Visit (HOSPITAL_COMMUNITY)
Admission: RE | Admit: 2016-02-05 | Discharge: 2016-02-05 | Disposition: A | Discharge status: Home / Self Care | Payer: BLUE CROSS/BLUE SHIELD | Source: Ambulatory Visit | Attending: Urology | Admitting: Urology

## 2016-02-05 ENCOUNTER — Encounter (HOSPITAL_COMMUNITY): Payer: Self-pay | Admitting: *Deleted

## 2016-02-05 ENCOUNTER — Encounter (HOSPITAL_COMMUNITY)
Admission: RE | Disposition: A | Discharge status: Home / Self Care | Payer: Self-pay | Source: Ambulatory Visit | Attending: Urology

## 2016-02-05 DIAGNOSIS — N132 Hydronephrosis with renal and ureteral calculous obstruction: Secondary | ICD-10-CM | POA: Diagnosis not present

## 2016-02-05 DIAGNOSIS — Z87891 Personal history of nicotine dependence: Secondary | ICD-10-CM | POA: Insufficient documentation

## 2016-02-05 DIAGNOSIS — Z86718 Personal history of other venous thrombosis and embolism: Secondary | ICD-10-CM | POA: Insufficient documentation

## 2016-02-05 DIAGNOSIS — Z791 Long term (current) use of non-steroidal anti-inflammatories (NSAID): Secondary | ICD-10-CM | POA: Diagnosis not present

## 2016-02-05 DIAGNOSIS — E785 Hyperlipidemia, unspecified: Secondary | ICD-10-CM | POA: Diagnosis not present

## 2016-02-05 DIAGNOSIS — N201 Calculus of ureter: Secondary | ICD-10-CM | POA: Diagnosis present

## 2016-02-05 DIAGNOSIS — Z79899 Other long term (current) drug therapy: Secondary | ICD-10-CM | POA: Diagnosis not present

## 2016-02-05 DIAGNOSIS — Z841 Family history of disorders of kidney and ureter: Secondary | ICD-10-CM | POA: Diagnosis not present

## 2016-02-05 HISTORY — DX: Chronic kidney disease, unspecified: N18.9

## 2016-02-05 SURGERY — LITHOTRIPSY, ESWL
Anesthesia: LOCAL | Laterality: Right

## 2016-02-05 MED ORDER — CIPROFLOXACIN HCL 500 MG PO TABS
500.0000 mg | ORAL_TABLET | ORAL | Status: AC
  Administered 2016-02-05: 500 mg via ORAL
  Filled 2016-02-05: qty 1

## 2016-02-05 MED ORDER — DIAZEPAM 5 MG PO TABS
10.0000 mg | ORAL_TABLET | ORAL | Status: AC
  Administered 2016-02-05: 10 mg via ORAL
  Filled 2016-02-05: qty 2

## 2016-02-05 MED ORDER — SODIUM CHLORIDE 0.9 % IV SOLN
INTRAVENOUS | Status: DC
  Administered 2016-02-05: 08:00:00 via INTRAVENOUS

## 2016-02-05 MED ORDER — DIPHENHYDRAMINE HCL 25 MG PO CAPS
25.0000 mg | ORAL_CAPSULE | ORAL | Status: AC
  Administered 2016-02-05: 25 mg via ORAL
  Filled 2016-02-05: qty 1

## 2016-02-05 NOTE — H&P (Signed)
History of Present Illness Paul Swanson is a 50 year old male with a history of stones who is seen in follow-up from the emergency room for a right ureteral stone.     He was seen in the ER on 01/04/16 having developed severe right flank pain. It was moderately severe and was proceeded by gross hematuria several days previously. He indicated the pain is similar to stones that he has had in the past. He has been placed on Flomax. He reports he passed one stone previously and he said about 15 years ago he underwent lithotripsy in [facility]. He's intermittently had some pain on the right hand side but this has been relieved by Aleve. He has not required any narcotic pain medication. He has not seen his stone passed.  A CT scan revealed a 4 x 6 mm proximal ureteral stone with associated hydronephrosis. No renal calculi were present.   Past Medical History Problems  1. History of DVT of axillary vein, acute (I82.A19) 2. History of hyperlipidemia (Z86.39)  Surgical History Problems  1. History of No Surgical Problems  Current Meds 1. Naproxen Sodium 220 MG Oral Tablet;  Therapy: (Recorded:11Apr2017) to Recorded 2. Simvastatin 20 MG Oral Tablet;  Therapy: (Recorded:11Apr2017) to Recorded 3. Tamsulosin HCl - 0.4 MG Oral Capsule;  Therapy: (Recorded:11Apr2017) to Recorded  Allergies Medication  1. No Known Drug Allergies  Family History Problems  1. Family history of kidney stones (Z84.1) : Father  Social History Problems    Alcohol use (Z78.9)   Caffeine use (F15.90)   1   Former smoker [phone])   smoked for 10 years; quit 25 years ago   Married   Number of children   1 daughter   Occupation   claim professor  Review of Systems Genitourinary, constitutional, skin, eye, otolaryngeal, hematologic/lymphatic, cardiovascular, pulmonary, endocrine, musculoskeletal, gastrointestinal, neurological and psychiatric system(s) were reviewed and pertinent findings if present are  noted and are otherwise negative.  Genitourinary: hematuria.    Vitals Vital Signs [Data Includes: Last 1 Day]  Recorded: 11Apr2017 02:21PM  Height: 5 ft 11 in Weight: 185 lb  BMI Calculated: 25.8 BSA Calculated: 2.04 Blood Pressure: 139 / 93 Heart Rate: 77  Physical Exam Constitutional: Well nourished and well developed . No acute distress.   ENT:. The ears and nose are normal in appearance.   Neck: The appearance of the neck is normal and no neck mass is present.   Pulmonary: No respiratory distress and normal respiratory rhythm and effort.   Cardiovascular: Heart rate and rhythm are normal . No peripheral edema.   Abdomen: The abdomen is soft and nontender. No masses are palpated. No CVA tenderness. No hernias are palpable. No hepatosplenomegaly noted.   Lymphatics: The femoral and inguinal nodes are not enlarged or tender.   Skin: Normal skin turgor, no visible rash and no visible skin lesions.   Neuro/Psych:. Mood and affect are appropriate.    Results/Data Urine [Data Includes: Last 1 Day]   11Apr2017  COLOR YELLOW   APPEARANCE CLOUDY   SPECIFIC GRAVITY 1.020   pH 6.0   GLUCOSE NEGATIVE   BILIRUBIN NEGATIVE   KETONE NEGATIVE   BLOOD 3+   PROTEIN NEGATIVE   NITRITE NEGATIVE   LEUKOCYTE ESTERASE NEGATIVE   SQUAMOUS EPITHELIAL/HPF 0-5 HPF  WBC 0-5 WBC/HPF  RBC >60 RBC/HPF  BACTERIA FEW HPF  CRYSTALS NONE SEEN HPF  CASTS NONE SEEN LPF  Yeast NONE SEEN HPF   Old records or history reviewed: ER notes as above.  The following images/tracing/specimen were independently visualized:  CT scan as above.  The following clinical lab reports were reviewed:  UA: Red cells were noted but is urine was otherwise clear.  CT scan.  The following radiology reports were reviewed:Marland Kitchen   I do see a density overlying the expected course of the proximal right ureter at the L2-3 interspace.    Assessment Assessed  1. Ureteral calculus, right (N20.1) 2. Hydronephrosis, right  (N13.30)  His stone is fairly faint but it is visible on KUB. If it remains in this location my hope is that it can be visualized for lithotripsy.     We discussed the management of urinary stones. These options include observation, ureteroscopy, shockwave lithotripsy, and PCNL. We discussed which options are relevant to these particular stones. We discussed the natural history of stones as well as the complications of untreated stones and the impact on quality of life without treatment as well as with each of the above listed treatments. We also discussed the efficacy of each treatment in its ability to clear the stone burden. With any of these management options I discussed the signs and symptoms of infection and the need for emergent treatment should these be experienced. For each option we discussed the ability of each procedure to clear the patient of their stone burden.    For observation I described the risks which include but are not limited to silent renal damage, life-threatening infection, need for emergent surgery, failure to pass stone, and pain.    For ureteroscopy I described the risks which include heart attack, stroke, pulmonary embolus, death, bleeding, infection, damage to contiguous structures, positioning injury, ureteral stricture, ureteral avulsion, ureteral injury, need for ureteral stent, inability to perform ureteroscopy, need for an interval procedure, inability to clear stone burden, stent discomfort and pain.    For shockwave lithotripsy I described the risks which include arrhythmia, kidney contusion, kidney hemorrhage, need for transfusion, long-term risk of diabetes or hypertension, back discomfort, flank ecchymosis, flank abrasion, inability to break up stone, inability to pass stone fragments, Steinstrasse, infection associated with obstructing stones, need for different surgical procedure and possible need for repeat shockwave lithotripsy.    He has had a  previous lithotripsy and it was successful and would like to proceed with lithotripsy rather than ureteroscopy and he said this stone is bothering him enough that he would like to go ahead and get something set up. I told him about the fact that the stone has to be visible in order to be treated and he understands this. He would like to proceed in that fashion. He will remain on medical expulsive therapy until that time.   Plan Health Maintenance  1. UA With REFLEX; [Do Not Release]; Status:Complete;   Done: [id number] 02:02PM Hydronephrosis, right  2. Follow-up Schedule Surgery Office  Follow-up  Status: Hold For - Appointment   Requested for: 11Apr2017 Ureteral calculus, right  3. KUB; Status:Complete;   Done: 11Apr2017 01:59PM  1. Continue medical expulsive therapy.  2. He'll be scheduled for lithotripsy of his right proximal ureteral stone.

## 2016-02-05 NOTE — Op Note (Signed)
See [facility] OP note scanned into chart. Also because of the size, density, location and other factors that cannot be anticipated I feel this will likely be a staged procedure. This fact supersedes any indication in the scanned [facility] operative note to the contrary.  

## 2016-02-05 NOTE — Discharge Instructions (Signed)
See [facility] discharge instructions in chart.  

## 2016-06-20 IMAGING — CR DG ABDOMEN 1V
2 series · 2 of 2 positions shown · non-contrast
Comparison: 01/04/2016

CLINICAL DATA: Right ureterolithiasis

EXAM:
ABDOMEN - 1 VIEW

[t abdomen supine (1 of 2)]
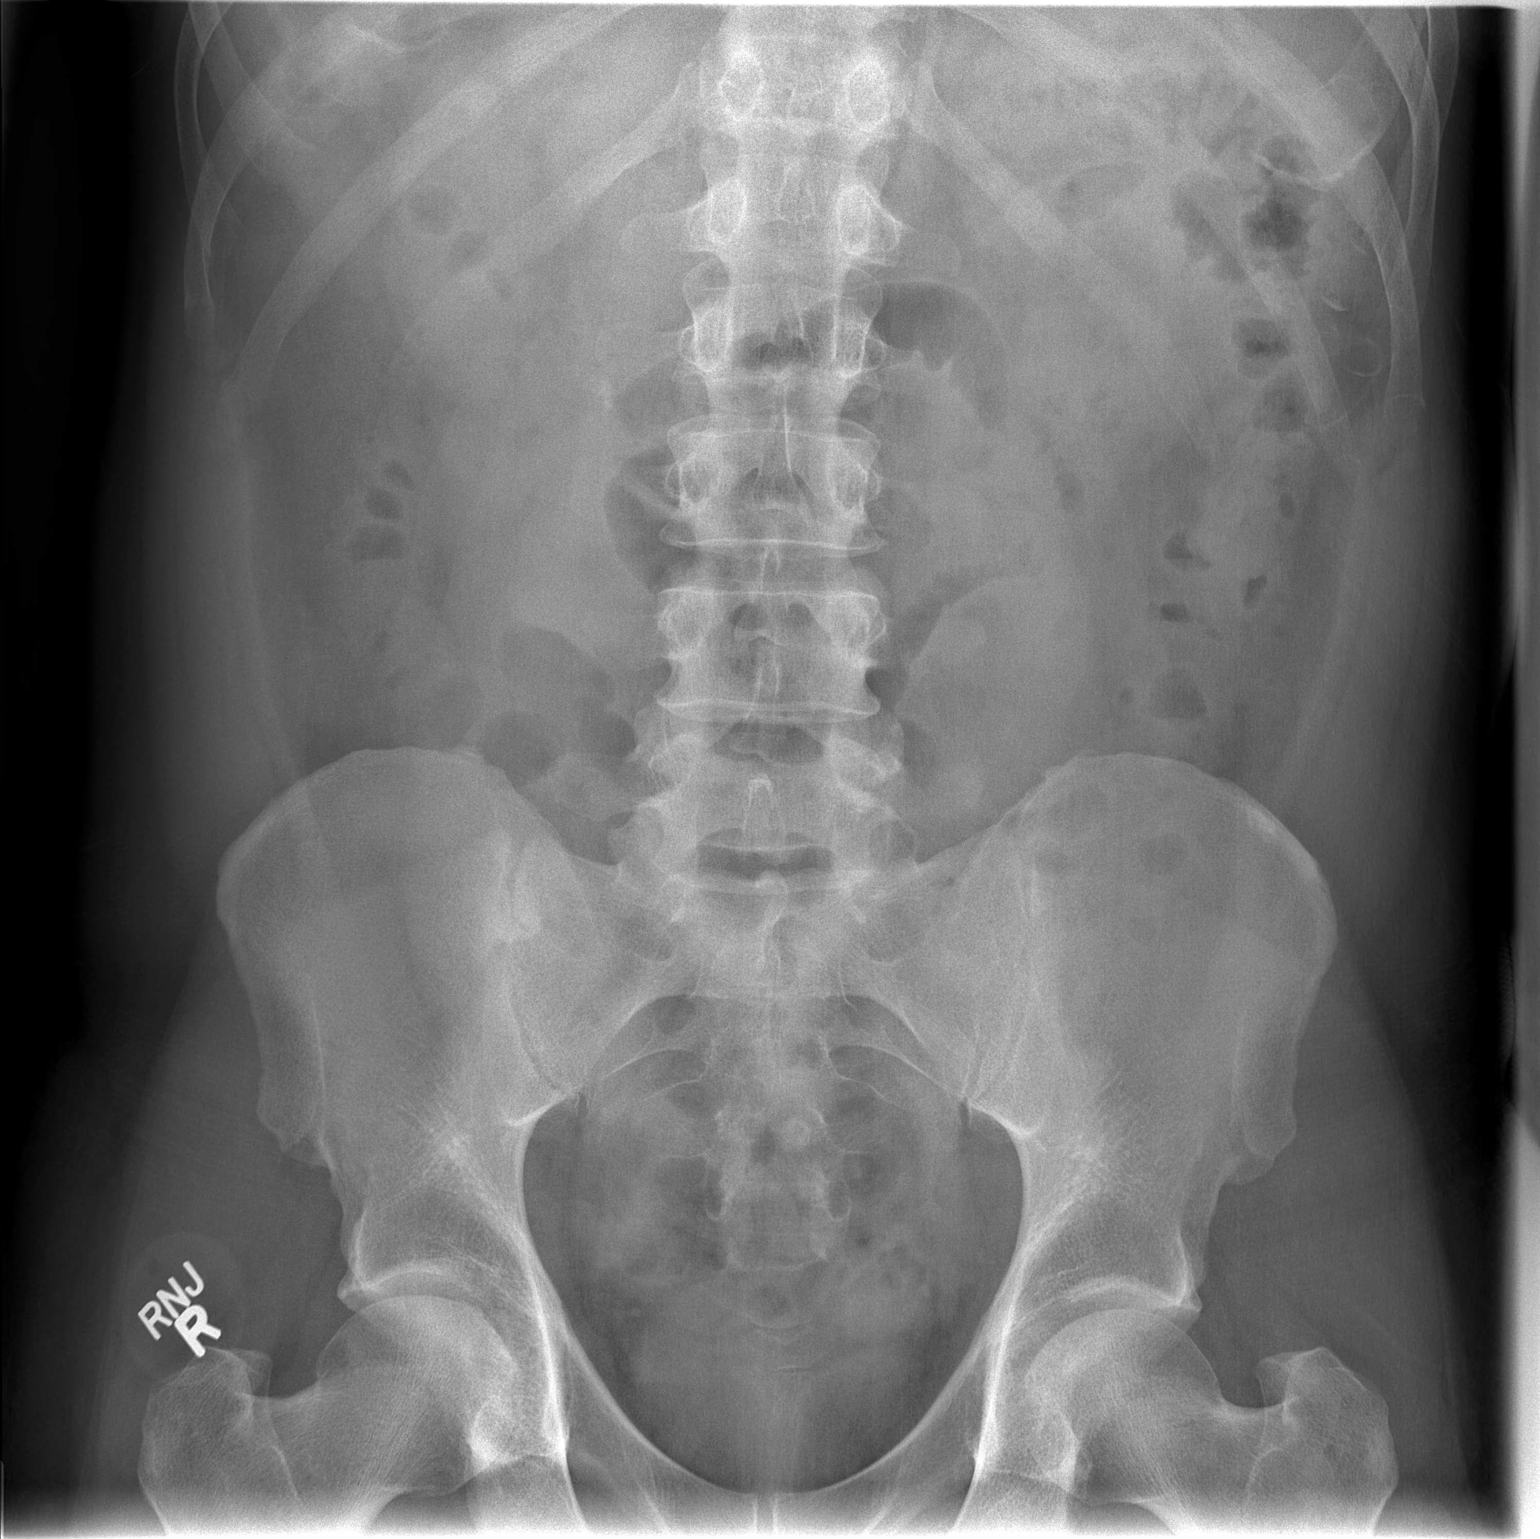

[t abdomen supine (2 of 2)]
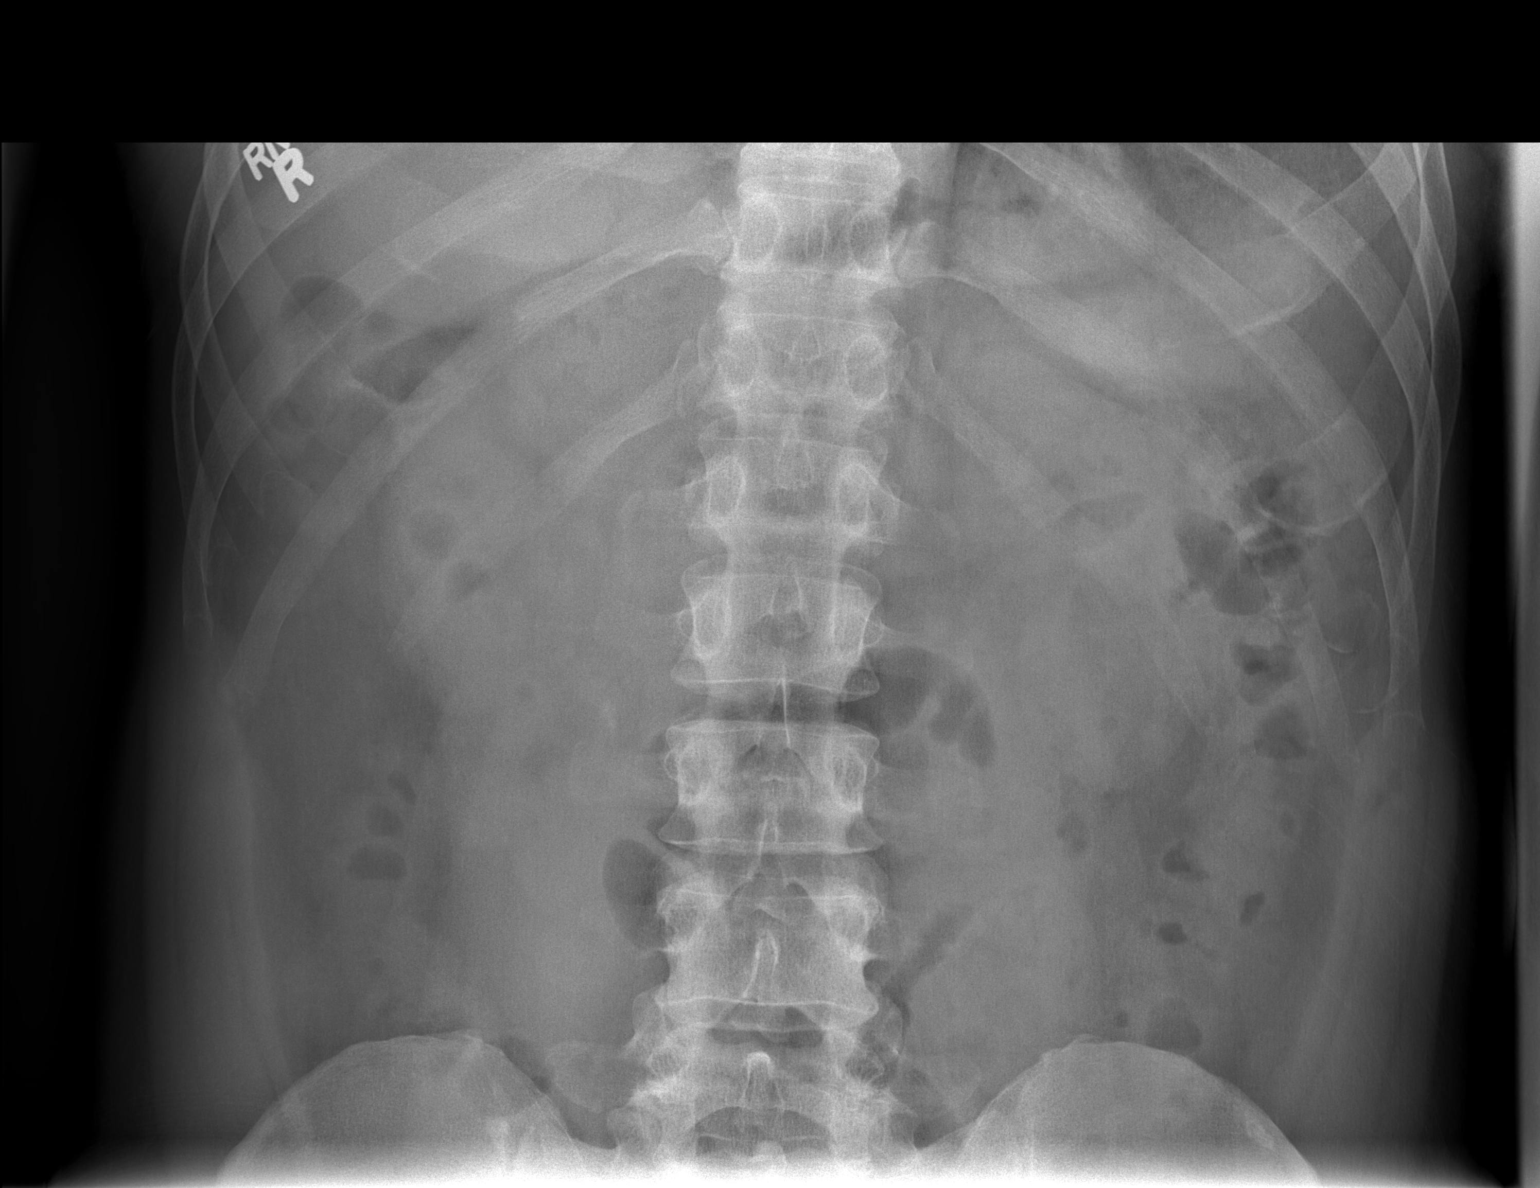

[2 of 2 positions shown; findings below may reference images not displayed]

FINDINGS: The right ureteral calculus is unchanged in position, visible at the
upper L3 level. The upper patch that the abdominal gas pattern is
negative for obstruction or perforation.
IMPRESSION: Right ureteral calculus at the upper L3 level.

## 2017-08-12 DIAGNOSIS — Z79899 Other long term (current) drug therapy: Secondary | ICD-10-CM | POA: Diagnosis not present

## 2017-08-12 DIAGNOSIS — E785 Hyperlipidemia, unspecified: Secondary | ICD-10-CM | POA: Diagnosis not present

## 2017-10-16 ENCOUNTER — Encounter (HOSPITAL_COMMUNITY): Payer: Self-pay | Admitting: Emergency Medicine

## 2017-10-16 ENCOUNTER — Other Ambulatory Visit: Payer: Self-pay

## 2017-10-16 DIAGNOSIS — Z79899 Other long term (current) drug therapy: Secondary | ICD-10-CM | POA: Insufficient documentation

## 2017-10-16 DIAGNOSIS — Z87891 Personal history of nicotine dependence: Secondary | ICD-10-CM | POA: Diagnosis not present

## 2017-10-16 DIAGNOSIS — M79605 Pain in left leg: Secondary | ICD-10-CM | POA: Diagnosis not present

## 2017-10-16 NOTE — ED Triage Notes (Signed)
Pt c/o left calf pain that started 45 minutes PTA. Hx DVT, not currently taking blood thinners. Denies injury/fall, no chest pain/shortness of breath.

## 2017-10-17 ENCOUNTER — Ambulatory Visit (HOSPITAL_BASED_OUTPATIENT_CLINIC_OR_DEPARTMENT_OTHER)
Admission: RE | Admit: 2017-10-17 | Discharge: 2017-10-17 | Disposition: A | Discharge status: Home / Self Care | Payer: BLUE CROSS/BLUE SHIELD | Source: Ambulatory Visit | Attending: Emergency Medicine | Admitting: Emergency Medicine

## 2017-10-17 ENCOUNTER — Encounter (HOSPITAL_COMMUNITY): Payer: BLUE CROSS/BLUE SHIELD

## 2017-10-17 ENCOUNTER — Emergency Department (HOSPITAL_COMMUNITY)
Admission: EM | Admit: 2017-10-17 | Discharge: 2017-10-17 | Disposition: A | Discharge status: Home / Self Care | Payer: BLUE CROSS/BLUE SHIELD | Attending: Emergency Medicine | Admitting: Emergency Medicine

## 2017-10-17 DIAGNOSIS — M79605 Pain in left leg: Secondary | ICD-10-CM

## 2017-10-17 DIAGNOSIS — M7989 Other specified soft tissue disorders: Secondary | ICD-10-CM

## 2017-10-17 DIAGNOSIS — M79609 Pain in unspecified limb: Secondary | ICD-10-CM | POA: Diagnosis not present

## 2017-10-17 LAB — CBC WITH DIFFERENTIAL/PLATELET
BASOS PCT: 0 %
Basophils Absolute: 0 10*3/uL (ref 0.0–0.1)
EOS ABS: 0.2 10*3/uL (ref 0.0–0.7)
EOS PCT: 2 %
HCT: 44.4 % (ref 39.0–52.0)
HEMOGLOBIN: 15.5 g/dL (ref 13.0–17.0)
LYMPHS ABS: 1.8 10*3/uL (ref 0.7–4.0)
Lymphocytes Relative: 20 %
MCH: 33.4 pg (ref 26.0–34.0)
MCHC: 34.9 g/dL (ref 30.0–36.0)
MCV: 95.7 fL (ref 78.0–100.0)
MONO ABS: 0.4 10*3/uL (ref 0.1–1.0)
MONOS PCT: 4 %
NEUTROS PCT: 74 %
Neutro Abs: 6.5 10*3/uL (ref 1.7–7.7)
PLATELETS: 182 10*3/uL (ref 150–400)
RBC: 4.64 MIL/uL (ref 4.22–5.81)
RDW: 14 % (ref 11.5–15.5)
WBC: 9 10*3/uL (ref 4.0–10.5)

## 2017-10-17 LAB — I-STAT CHEM 8, ED
BUN: 19 mg/dL (ref 6–20)
CALCIUM ION: 1.15 mmol/L (ref 1.15–1.40)
CHLORIDE: 107 mmol/L (ref 101–111)
Creatinine, Ser: 0.9 mg/dL (ref 0.61–1.24)
GLUCOSE: 143 mg/dL — AB (ref 65–99)
HCT: 46 % (ref 39.0–52.0)
Hemoglobin: 15.6 g/dL (ref 13.0–17.0)
Potassium: 4.1 mmol/L (ref 3.5–5.1)
SODIUM: 144 mmol/L (ref 135–145)
TCO2: 24 mmol/L (ref 22–32)

## 2017-10-17 MED ORDER — HYDROCODONE-ACETAMINOPHEN 5-325 MG PO TABS
1.0000 | ORAL_TABLET | Freq: Once | ORAL | Status: AC
  Administered 2017-10-17: 1 via ORAL
  Filled 2017-10-17: qty 1

## 2017-10-17 MED ORDER — RIVAROXABAN 15 MG PO TABS
15.0000 mg | ORAL_TABLET | Freq: Once | ORAL | Status: AC
  Administered 2017-10-17: 15 mg via ORAL
  Filled 2017-10-17: qty 1

## 2017-10-17 NOTE — Discharge Instructions (Signed)
Please follow-up on the instructions to get DVT study done.

## 2017-10-17 NOTE — ED Notes (Signed)
Dr. Kathrynn Humble explained plan of care to pt. and family.

## 2017-10-17 NOTE — ED Provider Notes (Signed)
[facility] EMERGENCY DEPARTMENT Provider Note   CSN: [id number] Arrival date & time: 10/16/17  2212     History   Chief Complaint Chief Complaint  Patient presents with  . Leg Pain    HPI Paul Swanson is a 52 y.o. male.  HPI 52 year old male with history of DVT, renal stones, hyperlipidemia comes in with chief complaint of left leg pain.  Patient reports that he had an acute DVT on the right lower extremity about 4 years ago.  Patient started having pain in his left calf early in the evening.  The pain feels like a strain, but he had most likely similar type of sudden pain when he had DVT in his right lower extremity.  Patient does travel for work.  He reports that as far as he knows, there was no blood dyscrasia found when he had the previous DVT.   Past Medical History:  Diagnosis Date  . DVT of axillary vein, acute right ([place]) 12/14  . Hyperlipidemia   . kidney stones     There are no active problems to display for this patient.   History reviewed. No pertinent surgical history.     Home Medications    Prior to Admission medications   Medication Sig Start Date End Date Taking? Authorizing Provider  naproxen sodium (ANAPROX) 220 MG tablet Take 440 mg by mouth 2 (two) times daily as needed (pain).    [provider]  ondansetron (ZOFRAN ODT) 8 MG disintegrating tablet 8mg  ODT q8 hours prn nausea 01/04/16   Palumbo, April, MD  oxyCODONE-acetaminophen (PERCOCET) 5-325 MG tablet Take 1 tablet by mouth every 6 (six) hours as needed. 01/04/16   Palumbo, April, MD  simvastatin (ZOCOR) 20 MG tablet Take 20 mg by mouth every evening.    [provider]  tamsulosin (FLOMAX) 0.4 MG CAPS capsule Take 0.4 mg by mouth daily. 12/27/15   [provider]  XARELTO STARTER PACK 15 & 20 MG TBPK Take 15-20 mg by mouth as directed. Take as directed on package: Start with one 15mg  tablet by mouth twice a day with food. On Day 22, switch to one 20mg   tablet once a day with food. Patient not taking: Reported on 01/04/2016 09/05/13   Jeannett Senior, PA-C    Family History No family history on file.  Social History Social History   Tobacco Use  . Smoking status: Former Research scientist (life sciences)  . Smokeless tobacco: Former Systems developer    Quit date: 08/19/1991  Substance Use Topics  . Alcohol use: Yes    Comment: social  . Drug use: No     Allergies   Patient has no known allergies.   Review of Systems Review of Systems  Constitutional: Negative for activity change.  Respiratory: Negative for shortness of breath.   Cardiovascular: Negative for chest pain.  Musculoskeletal: Positive for myalgias.  Hematological: Does not bruise/bleed easily.     Physical Exam Updated Vital Signs BP (!) 129/91 (BP Location: Left Arm)   Pulse 92   Temp 99.4 F (37.4 C) (Oral)   Resp 16   Ht 5\' 11"  (1.803 m)   Wt 83.9 kg (185 lb)   SpO2 97%   BMI 25.80 kg/m   Physical Exam  Constitutional: He is oriented to person, place, and time. He appears well-developed.  HENT:  Head: Atraumatic.  Neck: Neck supple.  Cardiovascular: Normal rate.  Pulmonary/Chest: Effort normal.  Neurological: He is alert and oriented to person, place, and time.  Skin:  Skin is warm.  Nursing note and vitals reviewed.    ED Treatments / Results  Labs (all labs ordered are listed, but only abnormal results are displayed) Labs Reviewed  I-STAT CHEM 8, ED - Abnormal; Notable for the following components:      Result Value   Glucose, Bld 143 (*)    All other components within normal limits  CBC WITH DIFFERENTIAL/PLATELET    EKG  EKG Interpretation None       Radiology No results found.  Procedures Procedures (including critical care time)  Medications Ordered in ED Medications  Rivaroxaban (XARELTO) tablet 15 mg (15 mg Oral Given 10/17/17 0251)  HYDROcodone-acetaminophen (NORCO/VICODIN) 5-325 MG per tablet 1 tablet (1 tablet Oral Given 10/17/17 0251)      Initial Impression / Assessment and Plan / ED Course  I have reviewed the triage vital signs and the nursing notes.  Pertinent labs & imaging results that were available during my care of the patient were reviewed by me and considered in my medical decision making (see chart for details).    Patient comes in with chief complaint of left leg pain.  Patient has history of DVT in the right lower extremity, and he does travel a lot for work.  Patient's well score is 3, Xarelto given, and outpatient DVT study ordered.  Xarelto prescription was not provided.  Patient informed that if the DVT studies negative he needs to follow-up with his primary care doctor.  Final Clinical Impressions(s) / ED Diagnoses   Final diagnoses:  Acute leg pain, left    ED Discharge Orders        Ordered    LE VENOUS     10/17/17 [place], [place], MD 10/17/17 [phone]

## 2017-10-17 NOTE — Progress Notes (Signed)
Left Lower extremity venous duplex completed. No evidence of DVT, superficial thrombosis, or Baker's cyst. Toma Copier, RVS  10/17/2017 8:46 AM

## 2017-10-22 ENCOUNTER — Emergency Department (HOSPITAL_COMMUNITY)
Admission: EM | Admit: 2017-10-22 | Discharge: 2017-10-22 | Disposition: A | Discharge status: Home / Self Care | Payer: BLUE CROSS/BLUE SHIELD | Attending: Emergency Medicine | Admitting: Emergency Medicine

## 2017-10-22 ENCOUNTER — Emergency Department (HOSPITAL_BASED_OUTPATIENT_CLINIC_OR_DEPARTMENT_OTHER)
Admit: 2017-10-22 | Discharge: 2017-10-22 | Disposition: A | Discharge status: Home / Self Care | Payer: BLUE CROSS/BLUE SHIELD | Attending: Emergency Medicine | Admitting: Emergency Medicine

## 2017-10-22 ENCOUNTER — Encounter (HOSPITAL_COMMUNITY): Payer: Self-pay | Admitting: Emergency Medicine

## 2017-10-22 ENCOUNTER — Other Ambulatory Visit: Payer: Self-pay

## 2017-10-22 DIAGNOSIS — S76312A Strain of muscle, fascia and tendon of the posterior muscle group at thigh level, left thigh, initial encounter: Secondary | ICD-10-CM | POA: Diagnosis not present

## 2017-10-22 DIAGNOSIS — S8992XA Unspecified injury of left lower leg, initial encounter: Secondary | ICD-10-CM | POA: Diagnosis not present

## 2017-10-22 DIAGNOSIS — M79662 Pain in left lower leg: Secondary | ICD-10-CM | POA: Diagnosis not present

## 2017-10-22 DIAGNOSIS — M7989 Other specified soft tissue disorders: Secondary | ICD-10-CM | POA: Diagnosis not present

## 2017-10-22 DIAGNOSIS — Z87891 Personal history of nicotine dependence: Secondary | ICD-10-CM | POA: Diagnosis not present

## 2017-10-22 DIAGNOSIS — Y929 Unspecified place or not applicable: Secondary | ICD-10-CM | POA: Diagnosis not present

## 2017-10-22 DIAGNOSIS — Y999 Unspecified external cause status: Secondary | ICD-10-CM | POA: Insufficient documentation

## 2017-10-22 DIAGNOSIS — Y33XXXA Other specified events, undetermined intent, initial encounter: Secondary | ICD-10-CM | POA: Diagnosis not present

## 2017-10-22 DIAGNOSIS — T148XXA Other injury of unspecified body region, initial encounter: Secondary | ICD-10-CM

## 2017-10-22 DIAGNOSIS — Y939 Activity, unspecified: Secondary | ICD-10-CM | POA: Diagnosis not present

## 2017-10-22 DIAGNOSIS — Z79899 Other long term (current) drug therapy: Secondary | ICD-10-CM | POA: Diagnosis not present

## 2017-10-22 DIAGNOSIS — S86812A Strain of other muscle(s) and tendon(s) at lower leg level, left leg, initial encounter: Secondary | ICD-10-CM | POA: Insufficient documentation

## 2017-10-22 DIAGNOSIS — M791 Myalgia, unspecified site: Secondary | ICD-10-CM | POA: Diagnosis not present

## 2017-10-22 LAB — D-DIMER, QUANTITATIVE: D-Dimer, Quant: 0.73 ug/mL-FEU — ABNORMAL HIGH (ref 0.00–0.50)

## 2017-10-22 NOTE — ED Notes (Signed)
Patient transported to Ultrasound 

## 2017-10-22 NOTE — Discharge Instructions (Addendum)
Your ultrasound was negative for blood clot in your leg today.   Make sure that you wear compression stockings or ace wrap to help with venous return as discussed. Elevate your extremity above the heart when possible to help with blood return.  Follow up with your primary care provider in a week. Ibuprofen as needed for pain. Return if symptoms worsen or new concerning symptoms in the meantime.

## 2017-10-22 NOTE — ED Notes (Signed)
Vascular tech aware of patient 

## 2017-10-22 NOTE — ED Provider Notes (Signed)
[facility] EMERGENCY DEPARTMENT Provider Note   CSN: [id number] Arrival date & time: 10/22/17  1245     History   Chief Complaint Chief Complaint  Patient presents with  . Leg Pain    HPI Paul Swanson is a 52 y.o. male with past medical history significant for right lower extremity DVT, and hyperlipidemia presenting with persistent left lower extremity edema and now with ecchymosis to the posterior calf.  Patient denies any trauma, stress or injury.  He has a sedentary lifestyle is not on anticoagulant at this time.  He was seen Sunday night after hours and sent home with Xarelto with a return for DVT study Monday which was negative.  He reports improvement in the pain but the swelling has worsened and lower extremity has discoloration as well as new findings of ecchymosis since the last visit.  Patient and wife at bedside are concerned that the cough may not have been seen on the prior study.  Wife has given him baby aspirin this morning.  His pain is aggravated by plantar flexion dorsiflexion. He denies recent surgery, cough, shortness of breath, chest pain, or other symptoms.  HPI  Past Medical History:  Diagnosis Date  . DVT of axillary vein, acute right ([place]) 12/14  . Hyperlipidemia   . kidney stones     There are no active problems to display for this patient.   History reviewed. No pertinent surgical history.     Home Medications    Prior to Admission medications   Medication Sig Start Date End Date Taking? Authorizing Provider  naproxen sodium (ANAPROX) 220 MG tablet Take 440 mg by mouth 2 (two) times daily as needed (pain).    [provider]  ondansetron (ZOFRAN ODT) 8 MG disintegrating tablet 8mg  ODT q8 hours prn nausea 01/04/16   Palumbo, April, MD  oxyCODONE-acetaminophen (PERCOCET) 5-325 MG tablet Take 1 tablet by mouth every 6 (six) hours as needed. 01/04/16   Palumbo, April, MD  simvastatin (ZOCOR) 20 MG tablet Take 20 mg by mouth  every evening.    [provider]  tamsulosin (FLOMAX) 0.4 MG CAPS capsule Take 0.4 mg by mouth daily. 12/27/15   [provider]  XARELTO STARTER PACK 15 & 20 MG TBPK Take 15-20 mg by mouth as directed. Take as directed on package: Start with one 15mg  tablet by mouth twice a day with food. On Day 22, switch to one 20mg  tablet once a day with food. Patient not taking: Reported on 01/04/2016 09/05/13   Jeannett Senior, PA-C    Family History No family history on file.  Social History Social History   Tobacco Use  . Smoking status: Former Research scientist (life sciences)  . Smokeless tobacco: Former Systems developer    Quit date: 08/19/1991  Substance Use Topics  . Alcohol use: Yes    Comment: social  . Drug use: No     Allergies   Patient has no known allergies.   Review of Systems Review of Systems  Constitutional: Negative for chills, diaphoresis and fever.  Respiratory: Negative for cough, choking, chest tightness, shortness of breath, wheezing and stridor.   Cardiovascular: Positive for leg swelling. Negative for chest pain and palpitations.  Gastrointestinal: Negative for abdominal pain, nausea and vomiting.  Genitourinary: Negative for dysuria and hematuria.  Musculoskeletal: Positive for myalgias. Negative for arthralgias, back pain, gait problem, joint swelling, neck pain and neck stiffness.  Skin: Positive for color change. Negative for pallor, rash and wound.  Neurological: Negative for dizziness,  seizures, syncope, weakness, light-headedness, numbness and headaches.     Physical Exam Updated Vital Signs BP (!) 138/93 (BP Location: Right Arm)   Pulse 81   Temp 98.3 F (36.8 C) (Oral)   Resp 18   Ht 5\' 11"  (1.803 m)   Wt 83.9 kg (185 lb)   SpO2 98%   BMI 25.80 kg/m   Physical Exam  Constitutional: He appears well-developed and well-nourished. No distress.  Afebrile, nontoxic-appearing, sitting comfortably in bed no acute distress.  HENT:  Head: Normocephalic and  atraumatic.  Eyes: Conjunctivae and EOM are normal. Right eye exhibits no discharge. Left eye exhibits no discharge. No scleral icterus.  Neck: Normal range of motion.  Cardiovascular: Normal rate, regular rhythm, normal heart sounds and intact distal pulses.  No murmur heard. Pulmonary/Chest: Effort normal and breath sounds normal. No stridor. No respiratory distress. He has no wheezes. He has no rales. He exhibits no tenderness.  Abdominal: He exhibits no distension.  Musculoskeletal: Normal range of motion. He exhibits edema and tenderness. He exhibits no deformity.  Left lower extremity edema including the entire lower leg, discoloration resembling jaundice on left lower extremity and ecchymosis to the posterior calf. Tenderness to deep palpation of the calf muscles.  Neurological: He is alert. No sensory deficit. He exhibits normal muscle tone.  Neurovascularly intact.  Skin: Skin is warm and dry. No rash noted. He is not diaphoretic. No erythema. There is pallor.  Psychiatric: He has a normal mood and affect.  Nursing note and vitals reviewed.    ED Treatments / Results  Labs (all labs ordered are listed, but only abnormal results are displayed) Labs Reviewed  D-DIMER, QUANTITATIVE (NOT AT [facility]) - Abnormal; Notable for the following components:      Result Value   D-Dimer, Quant 0.73 (*)    All other components within normal limits    EKG  EKG Interpretation None       Radiology No results found.  Procedures Procedures (including critical care time)  Medications Ordered in ED Medications - No data to display   Initial Impression / Assessment and Plan / ED Course  I have reviewed the triage vital signs and the nursing notes.  Pertinent labs & imaging results that were available during my care of the patient were reviewed by me and considered in my medical decision making (see chart for details).    Patient presents with left lower extremity edema and pain. DVT  study on Monday was negative. Pain has overall improved. Returns with worsening edema and new finding of ecchymosis to the posterior calf.  Dimer positive DVT study negative  Will apply Ace wrap and recommended elevation, rest and follow-up with primary care.  Most likely muscle strain.   Discharge home with symptomatic relief and close follow-up with PCP if symptoms persist.  She has no chest pain, shortness of breath and is hemodynamically stable.  Discussed strict return precautions and advised to return to the emergency department if experiencing any new or worsening symptoms. Instructions were understood and patient agreed with discharge plan.  Final Clinical Impressions(s) / ED Diagnoses   Final diagnoses:  Pain of left calf  Muscle strain    ED Discharge Orders        Ordered    Compression stockings     10/22/17 1330       Dossie Der 10/22/17 1415    Mabe, Forbes Cellar, MD 10/22/17 1426

## 2017-10-22 NOTE — ED Notes (Signed)
Patient is getting undress patient is resting with family at bedside and call bell in reach

## 2017-10-22 NOTE — Progress Notes (Addendum)
VASCULAR LAB PRELIMINARY  PRELIMINARY  PRELIMINARY  PRELIMINARY  Left lower extremity venous duplex completed.    Preliminary report:  There is no DVT or SVT noted in the left lower extremity.  There is a small hypoechoic area noted in the proximal calf that may possibly be consistent with a small muscle tear.   Gave report to Avie Echevaria, PA-C  Raphaella Larkin, RVT 10/22/2017, 1:30 PM

## 2017-10-22 NOTE — ED Triage Notes (Signed)
Pt. Stated, I was dx with blood clot on rt. Leg about 3 years ago and I had pain in my left leg behind knee. I had a doppler study on Monday which was negative. Donnald Garre got some bruising behind there and ankle swollen. Wife stated, I think either it was misdiagnosed or he needs a CT.

## 2017-10-22 NOTE — ED Notes (Signed)
Pt and family impatient with wait for ultrasound. Ultrasound called for update on wait time but no answer

## 2017-10-25 DIAGNOSIS — L723 Sebaceous cyst: Secondary | ICD-10-CM | POA: Diagnosis not present

## 2017-10-25 DIAGNOSIS — T148XXA Other injury of unspecified body region, initial encounter: Secondary | ICD-10-CM | POA: Diagnosis not present

## 2017-12-02 DIAGNOSIS — L723 Sebaceous cyst: Secondary | ICD-10-CM | POA: Diagnosis not present

## 2018-09-29 DIAGNOSIS — N529 Male erectile dysfunction, unspecified: Secondary | ICD-10-CM | POA: Diagnosis not present

## 2018-09-29 DIAGNOSIS — E785 Hyperlipidemia, unspecified: Secondary | ICD-10-CM | POA: Diagnosis not present

## 2018-09-29 DIAGNOSIS — Z125 Encounter for screening for malignant neoplasm of prostate: Secondary | ICD-10-CM | POA: Diagnosis not present

## 2018-09-29 DIAGNOSIS — R7301 Impaired fasting glucose: Secondary | ICD-10-CM | POA: Diagnosis not present

## 2019-01-04 DIAGNOSIS — L02213 Cutaneous abscess of chest wall: Secondary | ICD-10-CM | POA: Diagnosis not present

## 2019-03-04 DIAGNOSIS — Z20828 Contact with and (suspected) exposure to other viral communicable diseases: Secondary | ICD-10-CM | POA: Diagnosis not present

## 2020-09-10 DIAGNOSIS — Z0279 Encounter for issue of other medical certificate: Secondary | ICD-10-CM
